# Patient Record
Sex: Female | Born: 1935 | Race: White | Hispanic: No | Marital: Married | Smoking: Never smoker
Health system: Southern US, Community
[De-identification: ages and names within clinical notes are randomized; demographics above are authoritative.]

## PROBLEM LIST (undated history)

## (undated) DIAGNOSIS — F0390 Unspecified dementia without behavioral disturbance: Secondary | ICD-10-CM

## (undated) DIAGNOSIS — I447 Left bundle-branch block, unspecified: Secondary | ICD-10-CM

## (undated) DIAGNOSIS — I4891 Unspecified atrial fibrillation: Secondary | ICD-10-CM

## (undated) DIAGNOSIS — D696 Thrombocytopenia, unspecified: Secondary | ICD-10-CM

## (undated) DIAGNOSIS — E785 Hyperlipidemia, unspecified: Secondary | ICD-10-CM

## (undated) DIAGNOSIS — M1990 Unspecified osteoarthritis, unspecified site: Secondary | ICD-10-CM

## (undated) DIAGNOSIS — I4892 Unspecified atrial flutter: Secondary | ICD-10-CM

## (undated) DIAGNOSIS — M545 Low back pain, unspecified: Secondary | ICD-10-CM

## (undated) DIAGNOSIS — I10 Essential (primary) hypertension: Secondary | ICD-10-CM

## (undated) DIAGNOSIS — S22089A Unspecified fracture of T11-T12 vertebra, initial encounter for closed fracture: Secondary | ICD-10-CM

## (undated) DIAGNOSIS — M8580 Other specified disorders of bone density and structure, unspecified site: Secondary | ICD-10-CM

## (undated) HISTORY — PX: CHOLECYSTECTOMY: SHX55

## (undated) HISTORY — PX: TOTAL KNEE ARTHROPLASTY: SHX125

## (undated) HISTORY — PX: ABDOMINAL HYSTERECTOMY: SHX81

---

## 2008-08-03 ENCOUNTER — Ambulatory Visit: Payer: Self-pay | Admitting: Family Medicine

## 2008-11-16 ENCOUNTER — Ambulatory Visit: Payer: Self-pay | Admitting: Family Medicine

## 2009-08-09 ENCOUNTER — Ambulatory Visit: Payer: Self-pay | Admitting: Surgery

## 2010-10-19 ENCOUNTER — Ambulatory Visit: Payer: Self-pay | Admitting: Surgery

## 2011-10-23 ENCOUNTER — Ambulatory Visit: Payer: Self-pay | Admitting: Surgery

## 2011-10-24 ENCOUNTER — Ambulatory Visit: Payer: Self-pay | Admitting: Surgery

## 2012-09-01 ENCOUNTER — Ambulatory Visit: Payer: Self-pay | Admitting: Surgery

## 2012-09-16 ENCOUNTER — Ambulatory Visit: Payer: Self-pay | Admitting: Internal Medicine

## 2012-10-15 ENCOUNTER — Ambulatory Visit: Payer: Self-pay | Admitting: Hematology and Oncology

## 2012-10-17 ENCOUNTER — Ambulatory Visit: Payer: Self-pay | Admitting: Hematology and Oncology

## 2012-10-17 LAB — CBC CANCER CENTER
Basophil #: 0 x10 3/mm (ref 0.0–0.1)
Basophil %: 0.7 %
Eosinophil %: 1.4 %
HCT: 41.2 % (ref 35.0–47.0)
Lymphocyte #: 1.4 x10 3/mm (ref 1.0–3.6)
MCH: 29.5 pg (ref 26.0–34.0)
MCHC: 34.4 g/dL (ref 32.0–36.0)
Monocyte #: 0.4 x10 3/mm (ref 0.2–0.9)
Monocyte %: 8.2 %
Neutrophil #: 2.6 x10 3/mm (ref 1.4–6.5)
Platelet: 124 x10 3/mm — ABNORMAL LOW (ref 150–440)
WBC: 4.5 x10 3/mm (ref 3.6–11.0)

## 2012-10-19 ENCOUNTER — Ambulatory Visit: Payer: Self-pay | Admitting: Hematology and Oncology

## 2012-11-14 LAB — CBC CANCER CENTER
Basophil #: 0 x10 3/mm (ref 0.0–0.1)
Basophil %: 0.8 %
HGB: 13.8 g/dL (ref 12.0–16.0)
MCH: 30 pg (ref 26.0–34.0)
MCHC: 34.8 g/dL (ref 32.0–36.0)
RDW: 13.5 % (ref 11.5–14.5)

## 2012-11-14 LAB — BASIC METABOLIC PANEL
BUN: 23 mg/dL — ABNORMAL HIGH (ref 7–18)
Calcium, Total: 9.4 mg/dL (ref 8.5–10.1)
Chloride: 99 mmol/L (ref 98–107)
Co2: 35 mmol/L — ABNORMAL HIGH (ref 21–32)
Creatinine: 1.25 mg/dL (ref 0.60–1.30)
EGFR (Non-African Amer.): 41 — ABNORMAL LOW
Glucose: 85 mg/dL (ref 65–99)
Osmolality: 279 (ref 275–301)

## 2012-11-18 ENCOUNTER — Ambulatory Visit: Payer: Self-pay | Admitting: Hematology and Oncology

## 2013-02-19 ENCOUNTER — Encounter: Payer: Self-pay | Admitting: Unknown Physician Specialty

## 2013-03-21 ENCOUNTER — Encounter: Payer: Self-pay | Admitting: Unknown Physician Specialty

## 2013-06-04 ENCOUNTER — Ambulatory Visit: Payer: Self-pay | Admitting: Hematology and Oncology

## 2013-06-04 LAB — BASIC METABOLIC PANEL
Anion Gap: 8 (ref 7–16)
BUN: 22 mg/dL — ABNORMAL HIGH (ref 7–18)
CO2: 35 mmol/L — AB (ref 21–32)
Calcium, Total: 9.4 mg/dL (ref 8.5–10.1)
Chloride: 98 mmol/L (ref 98–107)
Creatinine: 1.15 mg/dL (ref 0.60–1.30)
EGFR (African American): 52 — ABNORMAL LOW
EGFR (Non-African Amer.): 45 — ABNORMAL LOW
Glucose: 117 mg/dL — ABNORMAL HIGH (ref 65–99)
Osmolality: 286 (ref 275–301)
Potassium: 3 mmol/L — ABNORMAL LOW (ref 3.5–5.1)
SODIUM: 141 mmol/L (ref 136–145)

## 2013-06-04 LAB — CBC CANCER CENTER
Basophil #: 0 x10 3/mm (ref 0.0–0.1)
Basophil %: 0.9 %
Eosinophil #: 0.1 x10 3/mm (ref 0.0–0.7)
Eosinophil %: 2.6 %
HCT: 43.9 % (ref 35.0–47.0)
HGB: 14.6 g/dL (ref 12.0–16.0)
LYMPHS PCT: 33.6 %
Lymphocyte #: 1.7 x10 3/mm (ref 1.0–3.6)
MCH: 29.2 pg (ref 26.0–34.0)
MCHC: 33.2 g/dL (ref 32.0–36.0)
MCV: 88 fL (ref 80–100)
MONO ABS: 0.4 x10 3/mm (ref 0.2–0.9)
Monocyte %: 7.5 %
NEUTROS ABS: 2.8 x10 3/mm (ref 1.4–6.5)
Neutrophil %: 55.4 %
Platelet: 145 x10 3/mm — ABNORMAL LOW (ref 150–440)
RBC: 5 10*6/uL (ref 3.80–5.20)
RDW: 13.3 % (ref 11.5–14.5)
WBC: 5 x10 3/mm (ref 3.6–11.0)

## 2013-06-21 ENCOUNTER — Ambulatory Visit: Payer: Self-pay | Admitting: Hematology and Oncology

## 2014-01-14 ENCOUNTER — Inpatient Hospital Stay (HOSPITAL_COMMUNITY)
Admission: EM | Admit: 2014-01-14 | Discharge: 2014-01-18 | DRG: 551 | Disposition: A | Discharge status: Skilled Nursing Facility | Payer: Medicare Other | Source: Other Acute Inpatient Hospital | Attending: Neurosurgery | Admitting: Neurosurgery

## 2014-01-14 ENCOUNTER — Encounter (HOSPITAL_COMMUNITY): Payer: Self-pay | Admitting: Pulmonary Disease

## 2014-01-14 ENCOUNTER — Observation Stay: Payer: Self-pay | Admitting: Internal Medicine

## 2014-01-14 DIAGNOSIS — E785 Hyperlipidemia, unspecified: Secondary | ICD-10-CM | POA: Diagnosis present

## 2014-01-14 DIAGNOSIS — S22009A Unspecified fracture of unspecified thoracic vertebra, initial encounter for closed fracture: Secondary | ICD-10-CM | POA: Diagnosis present

## 2014-01-14 DIAGNOSIS — I621 Nontraumatic extradural hemorrhage: Secondary | ICD-10-CM | POA: Diagnosis present

## 2014-01-14 DIAGNOSIS — E876 Hypokalemia: Secondary | ICD-10-CM | POA: Diagnosis present

## 2014-01-14 DIAGNOSIS — D696 Thrombocytopenia, unspecified: Secondary | ICD-10-CM | POA: Diagnosis present

## 2014-01-14 DIAGNOSIS — F70 Mild intellectual disabilities: Secondary | ICD-10-CM | POA: Diagnosis present

## 2014-01-14 DIAGNOSIS — W19XXXA Unspecified fall, initial encounter: Secondary | ICD-10-CM | POA: Diagnosis present

## 2014-01-14 DIAGNOSIS — I10 Essential (primary) hypertension: Secondary | ICD-10-CM

## 2014-01-14 DIAGNOSIS — F0280 Dementia in other diseases classified elsewhere without behavioral disturbance: Secondary | ICD-10-CM | POA: Diagnosis present

## 2014-01-14 DIAGNOSIS — R339 Retention of urine, unspecified: Secondary | ICD-10-CM | POA: Diagnosis present

## 2014-01-14 DIAGNOSIS — M546 Pain in thoracic spine: Secondary | ICD-10-CM

## 2014-01-14 DIAGNOSIS — G8929 Other chronic pain: Secondary | ICD-10-CM | POA: Diagnosis present

## 2014-01-14 DIAGNOSIS — S22008A Other fracture of unspecified thoracic vertebra, initial encounter for closed fracture: Secondary | ICD-10-CM | POA: Diagnosis present

## 2014-01-14 HISTORY — DX: Low back pain, unspecified: M54.50

## 2014-01-14 HISTORY — DX: Essential (primary) hypertension: I10

## 2014-01-14 HISTORY — DX: Unspecified fracture of t11-T12 vertebra, initial encounter for closed fracture: S22.089A

## 2014-01-14 HISTORY — DX: Thrombocytopenia, unspecified: D69.6

## 2014-01-14 HISTORY — DX: Hyperlipidemia, unspecified: E78.5

## 2014-01-14 HISTORY — DX: Low back pain: M54.5

## 2014-01-14 LAB — CBC WITH DIFFERENTIAL/PLATELET
BASOS PCT: 0.5 %
Basophil #: 0 10*3/uL (ref 0.0–0.1)
EOS PCT: 0.7 %
Eosinophil #: 0 10*3/uL (ref 0.0–0.7)
HCT: 46.2 % (ref 35.0–47.0)
HGB: 15.3 g/dL (ref 12.0–16.0)
LYMPHS PCT: 18.4 %
Lymphocyte #: 1.2 10*3/uL (ref 1.0–3.6)
MCH: 29.2 pg (ref 26.0–34.0)
MCHC: 33.1 g/dL (ref 32.0–36.0)
MCV: 88 fL (ref 80–100)
MONOS PCT: 4.8 %
Monocyte #: 0.3 x10 3/mm (ref 0.2–0.9)
Neutrophil #: 4.7 10*3/uL (ref 1.4–6.5)
Neutrophil %: 75.6 %
PLATELETS: 134 10*3/uL — AB (ref 150–440)
RBC: 5.25 10*6/uL — ABNORMAL HIGH (ref 3.80–5.20)
RDW: 13.9 % (ref 11.5–14.5)
WBC: 6.3 10*3/uL (ref 3.6–11.0)

## 2014-01-14 LAB — COMPREHENSIVE METABOLIC PANEL
ALT: 28 U/L
ANION GAP: 7 (ref 7–16)
AST: 45 U/L — AB (ref 15–37)
Albumin: 4.3 g/dL (ref 3.4–5.0)
Alkaline Phosphatase: 61 U/L
BILIRUBIN TOTAL: 1 mg/dL (ref 0.2–1.0)
BUN: 17 mg/dL (ref 7–18)
CALCIUM: 9.2 mg/dL (ref 8.5–10.1)
CHLORIDE: 107 mmol/L (ref 98–107)
Co2: 29 mmol/L (ref 21–32)
Creatinine: 1.01 mg/dL (ref 0.60–1.30)
GFR CALC NON AF AMER: 53 — AB
Glucose: 122 mg/dL — ABNORMAL HIGH (ref 65–99)
OSMOLALITY: 288 (ref 275–301)
POTASSIUM: 3.4 mmol/L — AB (ref 3.5–5.1)
SODIUM: 143 mmol/L (ref 136–145)
TOTAL PROTEIN: 8.1 g/dL (ref 6.4–8.2)

## 2014-01-14 LAB — URINALYSIS, COMPLETE
Bacteria: NONE SEEN
Bilirubin,UR: NEGATIVE
Blood: NEGATIVE
Glucose,UR: NEGATIVE mg/dL (ref 0–75)
KETONE: NEGATIVE
Leukocyte Esterase: NEGATIVE
Nitrite: NEGATIVE
Ph: 9 (ref 4.5–8.0)
Protein: NEGATIVE
RBC,UR: 2 /HPF (ref 0–5)
Specific Gravity: 1.01 (ref 1.003–1.030)
Squamous Epithelial: <1
WBC UR: <1 /HPF (ref 0–5)

## 2014-01-14 LAB — MRSA PCR SCREENING: MRSA by PCR: NEGATIVE

## 2014-01-14 LAB — TROPONIN I: Troponin-I: <0.02 ng/mL

## 2014-01-14 LAB — TSH: Thyroid Stimulating Horm: 0.801 u[IU]/mL

## 2014-01-14 MED ORDER — FLEET ENEMA 7-19 GM/118ML RE ENEM
1.0000 | ENEMA | Freq: Once | RECTAL | Status: AC | PRN
  Filled 2014-01-14: qty 1

## 2014-01-14 MED ORDER — ZOLPIDEM TARTRATE 5 MG PO TABS: 5.0000 mg | ORAL_TABLET | Freq: Every evening | ORAL | Status: DC | PRN

## 2014-01-14 MED ORDER — LISINOPRIL 10 MG PO TABS
10.0000 mg | ORAL_TABLET | Freq: Every day | ORAL | Status: DC
  Administered 2014-01-15 – 2014-01-17 (×3): 10 mg via ORAL
  Filled 2014-01-14 (×3): qty 1

## 2014-01-14 MED ORDER — ALUM & MAG HYDROXIDE-SIMETH 200-200-20 MG/5ML PO SUSP
30.0000 mL | Freq: Four times a day (QID) | ORAL | Status: DC | PRN
  Administered 2014-01-17: 30 mL via ORAL
  Filled 2014-01-14: qty 30

## 2014-01-14 MED ORDER — BISACODYL 10 MG RE SUPP: 10.0000 mg | Freq: Every day | RECTAL | Status: DC | PRN

## 2014-01-14 MED ORDER — KCL IN DEXTROSE-NACL 20-5-0.45 MEQ/L-%-% IV SOLN
INTRAVENOUS | Status: DC
  Filled 2014-01-14 (×3): qty 1000

## 2014-01-14 MED ORDER — HYDROCODONE-ACETAMINOPHEN 5-325 MG PO TABS
1.0000 | ORAL_TABLET | ORAL | Status: DC | PRN
  Administered 2014-01-14: 2 via ORAL
  Administered 2014-01-16: 1 via ORAL
  Administered 2014-01-17 – 2014-01-18 (×4): 2 via ORAL
  Filled 2014-01-14 (×5): qty 2
  Filled 2014-01-14: qty 1

## 2014-01-14 MED ORDER — MORPHINE SULFATE 2 MG/ML IJ SOLN
1.0000 mg | INTRAMUSCULAR | Status: DC | PRN
  Administered 2014-01-14 – 2014-01-16 (×4): 2 mg via INTRAVENOUS
  Filled 2014-01-14 (×4): qty 1

## 2014-01-14 MED ORDER — SENNA 8.6 MG PO TABS
1.0000 | ORAL_TABLET | Freq: Two times a day (BID) | ORAL | Status: DC
  Administered 2014-01-15 – 2014-01-18 (×6): 8.6 mg via ORAL
  Filled 2014-01-14 (×9): qty 1

## 2014-01-14 MED ORDER — ACETAMINOPHEN 650 MG RE SUPP: 650.0000 mg | Freq: Four times a day (QID) | RECTAL | Status: DC | PRN

## 2014-01-14 MED ORDER — DOCUSATE SODIUM 100 MG PO CAPS
100.0000 mg | ORAL_CAPSULE | Freq: Two times a day (BID) | ORAL | Status: DC
  Administered 2014-01-15 – 2014-01-18 (×6): 100 mg via ORAL
  Filled 2014-01-14 (×9): qty 1

## 2014-01-14 MED ORDER — SIMVASTATIN 5 MG PO TABS
10.0000 mg | ORAL_TABLET | Freq: Every day | ORAL | Status: DC
  Administered 2014-01-15 – 2014-01-17 (×3): 10 mg via ORAL
  Filled 2014-01-14: qty 2
  Filled 2014-01-14: qty 1
  Filled 2014-01-14: qty 2
  Filled 2014-01-14: qty 1

## 2014-01-14 MED ORDER — ONDANSETRON HCL 4 MG/2ML IJ SOLN
4.0000 mg | Freq: Four times a day (QID) | INTRAMUSCULAR | Status: DC | PRN
  Administered 2014-01-15 (×2): 4 mg via INTRAVENOUS
  Filled 2014-01-14 (×2): qty 2

## 2014-01-14 MED ORDER — ACETAMINOPHEN 325 MG PO TABS: 650.0000 mg | ORAL_TABLET | Freq: Four times a day (QID) | ORAL | Status: DC | PRN

## 2014-01-14 MED ORDER — SENNOSIDES-DOCUSATE SODIUM 8.6-50 MG PO TABS
1.0000 | ORAL_TABLET | Freq: Every evening | ORAL | Status: DC | PRN
  Filled 2014-01-14: qty 1

## 2014-01-14 MED ORDER — ONDANSETRON HCL 4 MG PO TABS: 4.0000 mg | ORAL_TABLET | Freq: Four times a day (QID) | ORAL | Status: DC | PRN

## 2014-01-14 MED ORDER — HYDROCHLOROTHIAZIDE 25 MG PO TABS
25.0000 mg | ORAL_TABLET | Freq: Every day | ORAL | Status: DC
  Administered 2014-01-15 – 2014-01-18 (×4): 25 mg via ORAL
  Filled 2014-01-14 (×4): qty 1

## 2014-01-14 NOTE — H&P (Signed)
Rebecca Reed is an 80 y.o. female.   Chief Complaint: Fall today with leg weakness.  Transferred from [facility]. HPI: Patient is 80 year old female who lives in assisted living facility who fell this morning and was unable to get up beacause of back and leg pain.  A thoracic CT scan at [facility] shows T 11 fracture with minimal retropulsion.  An MRI showed epidural hematoma effacing subarachnoid space without severe cord compression.  She was transferred by [facility] for management.  No past medical history on file. Bilateral TKA and HTN  No past surgical history on file.  No family history on file. Social History:  has no tobacco, alcohol, and drug history on file.  Allergies: Allergies not on file  No prescriptions prior to admission    No results found for this or any previous visit (from the past 48 hour(s)). No results found.  Review of Systems  HENT: Negative.   Eyes: Negative.   Respiratory: Negative.   Cardiovascular: Negative.   Gastrointestinal: Negative.   Genitourinary: Negative.   Musculoskeletal: Positive for back pain, falls and joint pain.  Skin: Negative.   Neurological: Positive for focal weakness and weakness.  Endo/Heme/Allergies: Negative.   Psychiatric/Behavioral: Negative.     Blood pressure 141/113, pulse 78, resp. rate 30, SpO2 100.00%. Physical Exam  Constitutional: She is oriented to person, place, and time. She appears well-developed and well-nourished.  HENT:  Head: Normocephalic and atraumatic.  Right Ear: External ear normal.  Left Ear: External ear normal.  Eyes: Conjunctivae and EOM are normal. Pupils are equal, round, and reactive to light.  Neck: Normal range of motion. Neck supple.  Cardiovascular: Normal rate, regular rhythm, normal heart sounds and intact distal pulses.   Respiratory: Effort normal and breath sounds normal.  GI: Soft. Bowel sounds are normal.  Musculoskeletal:       Thoracic back: She exhibits bony tenderness and pain.   Neurological: She is alert and oriented to person, place, and time. She has normal strength. No cranial nerve deficit or sensory deficit. GCS eye subscore is 4. GCS verbal subscore is 5. GCS motor subscore is 6. She displays no Babinski's sign on the right side. She displays no Babinski's sign on the left side.  Reflex Scores:      Tricep reflexes are 2+ on the right side and 2+ on the left side.      Bicep reflexes are 2+ on the right side and 2+ on the left side.      Brachioradialis reflexes are 2+ on the right side and 2+ on the left side.      Patellar reflexes are 2+ on the right side and 2+ on the left side.      Achilles reflexes are 2+ on the right side and 2+ on the left side. Psychiatric: She has a normal mood and affect. Her speech is normal and behavior is normal. Judgment and thought content normal. Cognition and memory are normal.    Rectal exam and perineal sensation normal.  Denies numbness in legs, does have some radicular pain in T 11 distribution bilaterally. Has back pain with leg movement, but exhibits full strength in all motor groups.  Assessment/Plan Patient appears to have back pain due to her acute fracture, but does not have weakness on exam and has normal rectal tone.  She will be admitted to ICU and observed and may mobilize with a brace for pain control.  Will perform serial neuro checks and observe in  ICU tonight.  Shontavia Mickel D 01/14/2014, 8:23 PM

## 2014-01-14 NOTE — Progress Notes (Signed)
[facility] Physician-Brief Progress Note Patient Name: Rebecca Reed DOB: [birth year] MRN: [id number]   Date of Service  01/14/2014  HPI/Events of Note  80 year old female with PMHx of HTN, HLD, cord compression, TKA, Chronic low back pain from ALF who had an accidental fall this morning and was unable to get up beacause of back and leg pain. A thoracic CT scan at [facility] shows T 11 fracture with minimal retropulsion. An MRI showed epidural hematoma effacing subarachnoid space without severe cord compression. She was [facility] for further management and evaluation by neurosurgery   eICU Interventions  Monitor BP - restart home meds, elevated may BP be related to pain.  Pain management will also be important.  Severe cord compression, epidural hematoma - management per surgery.  Frequent neuro checks, bp control   HTN  - restart home meds, may give hydralazine  IV for SBP >183mmHg, hold for HR>100 bpm       Intervention Category Evaluation Type: New Patient Evaluation  Taron Mondor 01/14/2014, 9:36 PM

## 2014-01-14 NOTE — Consult Note (Signed)
PULMONARY / CRITICAL CARE MEDICINE   Name: Rebecca Reed MRN: [id number] DOB: [birth year]    ADMISSION DATE:  01/14/2014 CONSULTATION DATE:  01/14/2014  REFERRING MD :  Venetia Maxon  CHIEF COMPLAINT:  Fall  INITIAL PRESENTATION: 80 yo female from ALF who presented to [facility] ED 8/27 s/p fall 8/27 AM. MRI t-spine showed burst fracture of T11 and epidural hematoma without severe cord compression. She was transferred to [facility] for neurosurgical evaluation. PCCM asked to consult for medical management.   STUDIES:  8/27 T spine MRI >stable burst freacture of T11 with epidural hematoma from T8-T11. 8/27 CT head > No acute intracranial abnormality, chronic age related atrophy  SIGNIFICANT EVENTS: 8/27 fall at ALF, to ED T11 fracture and epidural hematoma, to [facility] NICU.   HISTORY OF PRESENT ILLNESS:  80 year old female with PMH as below, which includes HTN, Presented to [facility] ED 8//27. She lives in assisted living facility where she 2 suffered 2 falls 8/27 AM. States she struck her head on second fall. CC in ED was back pain. In ED she was found to have T11 fracture with associated epidural hematoma. There was not severe cord compression. She was been transferred to [facility] NICU for neurosurgical evaluation. PCCM has been consulted for medical management.   PAST MEDICAL HISTORY :  Past Medical History  Diagnosis Date  . HTN (hypertension)   . Thrombocytopenia   . Memory loss   . Hyperlipidemia   . Low back pain     Chronic  . T11 vertebral fracture     01/14/2014   No past surgical history on file. Prior to Admission medications   Medication Sig Start Date End Date Taking? Authorizing Provider  Calcium Carb-Cholecalciferol (CALCIUM 600 + D) 600-200 MG-UNIT TABS Take 1 tablet by mouth daily.   Yes Historical Provider, MD  donepezil (ARICEPT) 5 MG tablet Take 5 mg by mouth at bedtime.   Yes Historical Provider, MD  hydrochlorothiazide (HYDRODIURIL) 25 MG tablet Take 25 mg by mouth daily.   Yes Historical  Provider, MD  lisinopril (PRINIVIL,ZESTRIL) 20 MG tablet Take 20 mg by mouth daily.   Yes Historical Provider, MD  Multiple Vitamin (MULTIVITAMIN WITH MINERALS) TABS tablet Take 1 tablet by mouth daily.   Yes Historical Provider, MD  simvastatin (ZOCOR) 10 MG tablet Take 10 mg by mouth every morning.   Yes Historical Provider, MD   No Known Allergies  FAMILY HISTORY:  No family history on file. SOCIAL HISTORY:  has no tobacco, alcohol, and drug history on file.  REVIEW OF SYSTEMS:   Bolds are positive  Constitutional: weight loss, gain, night sweats, Fevers, chills, fatigue .  HEENT: headaches, Sore throat, sneezing, nasal congestion, post nasal drip, Difficulty swallowing, Tooth/dental problems, visual complaints visual changes, ear ache CV:  chest pain, radiates: ,Orthopnea, PND, swelling in lower extremities, dizziness, palpitations, syncope.  GI  heartburn, indigestion, abdominal pain, nausea, vomiting, diarrhea, change in bowel habits, loss of appetite, bloody stools.  Resp: cough, productive: , hemoptysis, dyspnea, chest pain, pleuritic.  Skin: rash or itching or icterus GU: dysuria, change in color of urine, urgency or frequency. flank pain, hematuria  MS: Mid back pain or swelling. decreased range of motion  Psych: change in mood or affect. depression or anxiety.  Neuro: difficulty with speech, weakness, numbness, ataxia   SUBJECTIVE:   VITAL SIGNS: Temp:  [97.4 F (36.3 C)-98.7 F (37.1 C)] 98.3 F (36.8 C) (08/28 0800) Pulse Rate:  [61-114] 65 (08/28 0900) Resp:  [10-35]  11 (08/28 0900) BP: (131-209)/(48-113) 135/82 mmHg (08/28 0900) SpO2:  [95 %-100 %] 98 % (08/28 0900) HEMODYNAMICS:   VENTILATOR SETTINGS:   INTAKE / OUTPUT:  Intake/Output Summary (Last 24 hours) at 01/15/14 1009 Last data filed at 01/15/14 0930  Gross per 24 hour  Intake  637.5 ml  Output    950 ml  Net -312.5 ml    PHYSICAL EXAMINATION: General:  Elderly female, overweight, in mild  distress Neuro:  Alert, oriented. Some confusion. Responds and follows commands appropriately. Good sensation and strength to BLE.  HEENT:  [place]/AT, PERRL. No JVD noted.  Cardiovascular:  Tachy, regular rhythm Lungs:  Respirations even, unlabored. Clear anterior breath sounds bilaterally.  Abdomen:  Soft, non-distended. Normoactive BS Musculoskeletal:  No acute deformity. ROM limited by back pain.  Skin:  Intact  LABS:  CBC  Recent Labs Lab 01/15/14 0430  WBC 9.7  HGB 14.9  HCT 42.9  PLT 114*   Coag's  Recent Labs Lab 01/15/14 0430  INR 1.14   BMET  Recent Labs Lab 01/15/14 0430  NA 140  K 3.4*  CL 101  CO2 23  BUN 19  CREATININE 0.87  GLUCOSE 173*   Electrolytes  Recent Labs Lab 01/15/14 0430  CALCIUM 8.9   Sepsis Markers No results found for this basename: LATICACIDVEN, PROCALCITON, O2SATVEN,  in the last 168 hours ABG No results found for this basename: PHART, PCO2ART, PO2ART,  in the last 168 hours Liver Enzymes No results found for this basename: AST, ALT, ALKPHOS, BILITOT, ALBUMIN,  in the last 168 hours Cardiac Enzymes  Recent Labs Lab 01/15/14 0430  TROPONINI <0.30   Glucose No results found for this basename: GLUCAP,  in the last 168 hours  Imaging No results found.   ASSESSMENT / PLAN:  PULMONARY A: No acute issues  P:   Supplemental O2 if needed to maintain SpO2 greater than 92%  CARDIOVASCULAR A:  Hypertension - probably pain response LBBB on ECG, unclear if this in new finding.  H/o HTN  P:  PRN hydralazine for SBP > Continue preadmission antihypertensives (HCTZ, lisinopril) Continue preadmission statin Repeat ECG Trend troponin  RENAL A:   No acute issues  P:   KVO IVF Replace electrolytes as indicated Follow Bmet  GASTROINTESTINAL A:   No acute issues  P:   Regular Diet PO PPI for SUP  HEMATOLOGIC A:   Thrombocytopenia - appears chronic  P:  Follow CBC VTE ppx:  SCDs Coags  INFECTIOUS A:   No acute issues  P:   Monitor clinically  ENDOCRINE A:   No acute issues   P:   Monitor glucose with Bmet  NEUROLOGIC A:   T11 burst fracture with epidural hematoma T8-T11 Back pain H/o mild dementia reported  P:   RASS goal: 0 IV pain control per Neurosurgery (IV morphine, hydrocodone)  Joneen Roach, ACNP [place] [facility] Pager [phone] or [phone]  I have personally obtained a history, examined the patient, evaluated laboratory and imaging results, formulated the assessment and plan and placed orders.  Patient seen and examined, agree with above note.  I dictated the care and orders written for this patient under my direction.  Alyson Reedy, MD [phone]

## 2014-01-15 ENCOUNTER — Inpatient Hospital Stay (HOSPITAL_COMMUNITY): Payer: Medicare Other

## 2014-01-15 ENCOUNTER — Encounter (HOSPITAL_COMMUNITY): Payer: Self-pay | Admitting: Pulmonary Disease

## 2014-01-15 DIAGNOSIS — E876 Hypokalemia: Secondary | ICD-10-CM

## 2014-01-15 DIAGNOSIS — M546 Pain in thoracic spine: Secondary | ICD-10-CM

## 2014-01-15 DIAGNOSIS — S22009A Unspecified fracture of unspecified thoracic vertebra, initial encounter for closed fracture: Principal | ICD-10-CM

## 2014-01-15 DIAGNOSIS — I10 Essential (primary) hypertension: Secondary | ICD-10-CM

## 2014-01-15 LAB — TROPONIN I: Troponin I: <0.3 ng/mL (ref <0.30)

## 2014-01-15 LAB — URINALYSIS, ROUTINE W REFLEX MICROSCOPIC
Bilirubin Urine: NEGATIVE
GLUCOSE, UA: 100 mg/dL — AB
KETONES UR: 15 mg/dL — AB
Nitrite: NEGATIVE
PROTEIN: 100 mg/dL — AB
Specific Gravity, Urine: 1.013 (ref 1.005–1.030)
UROBILINOGEN UA: 0.2 mg/dL (ref 0.0–1.0)
pH: 7.5 (ref 5.0–8.0)

## 2014-01-15 LAB — BASIC METABOLIC PANEL
Anion gap: 16 — ABNORMAL HIGH (ref 5–15)
BUN: 19 mg/dL (ref 6–23)
CHLORIDE: 101 meq/L (ref 96–112)
CO2: 23 mEq/L (ref 19–32)
Calcium: 8.9 mg/dL (ref 8.4–10.5)
Creatinine, Ser: 0.87 mg/dL (ref 0.50–1.10)
GFR calc Af Amer: 71 mL/min — ABNORMAL LOW (ref >90)
GFR, EST NON AFRICAN AMERICAN: 61 mL/min — AB (ref >90)
Glucose, Bld: 173 mg/dL — ABNORMAL HIGH (ref 70–99)
POTASSIUM: 3.4 meq/L — AB (ref 3.7–5.3)
SODIUM: 140 meq/L (ref 137–147)

## 2014-01-15 LAB — CBC
HEMATOCRIT: 42.9 % (ref 36.0–46.0)
HEMOGLOBIN: 14.9 g/dL (ref 12.0–15.0)
MCH: 29.7 pg (ref 26.0–34.0)
MCHC: 34.7 g/dL (ref 30.0–36.0)
MCV: 85.6 fL (ref 78.0–100.0)
Platelets: 114 10*3/uL — ABNORMAL LOW (ref 150–400)
RBC: 5.01 MIL/uL (ref 3.87–5.11)
RDW: 13.9 % (ref 11.5–15.5)
WBC: 9.7 10*3/uL (ref 4.0–10.5)

## 2014-01-15 LAB — PROTIME-INR
INR: 1.14 (ref 0.00–1.49)
PROTHROMBIN TIME: 14.6 s (ref 11.6–15.2)

## 2014-01-15 LAB — URINE MICROSCOPIC-ADD ON

## 2014-01-15 MED ORDER — POTASSIUM CHLORIDE CRYS ER 20 MEQ PO TBCR
40.0000 meq | EXTENDED_RELEASE_TABLET | Freq: Three times a day (TID) | ORAL | Status: AC
  Administered 2014-01-15 (×2): 40 meq via ORAL
  Filled 2014-01-15 (×2): qty 2

## 2014-01-15 MED ORDER — HYDRALAZINE HCL 20 MG/ML IJ SOLN
10.0000 mg | INTRAMUSCULAR | Status: DC | PRN
  Administered 2014-01-15 (×2): 20 mg via INTRAVENOUS
  Administered 2014-01-17: 10 mg via INTRAVENOUS
  Filled 2014-01-15 (×3): qty 1

## 2014-01-15 NOTE — Progress Notes (Signed)
UR completed.  Deondra Labrador, RN BSN MHA CCM Trauma/Neuro ICU Case Manager [phone]  

## 2014-01-15 NOTE — Progress Notes (Signed)
Subjective: Patient reports back a bit better  Objective: Vital signs in last 24 hours: Temp:  [97.4 F (36.3 C)-98.7 F (37.1 C)] 98.4 F (36.9 C) (08/28 1600) Pulse Rate:  [29-114] 75 (08/28 1400) Resp:  [10-35] 14 (08/28 1400) BP: (110-209)/(48-113) 155/77 mmHg (08/28 1400) SpO2:  [81 %-100 %] 81 % (08/28 1400) Weight:  [71.6 kg (157 lb 13.6 oz)] 71.6 kg (157 lb 13.6 oz) (08/28 1100)  Intake/Output from previous day: 08/27 0701 - 08/28 0700 In: 487.5 [I.V.:487.5] Out: 50 [Urine:50] Intake/Output this shift: Total I/O In: 150 [I.V.:150] Out: 900 [Urine:900]  Physical Exam: Strength full in both legs.  No numbness.  Lab Results:  Recent Labs  01/15/14 0430  WBC 9.7  HGB 14.9  HCT 42.9  PLT 114*   BMET  Recent Labs  01/15/14 0430  NA 140  K 3.4*  CL 101  CO2 23  GLUCOSE 173*  BUN 19  CREATININE 0.87  CALCIUM 8.9    Studies/Results: Dg Chest Port 1 View  01/15/2014   CLINICAL DATA:  Central line placement.  EXAM: PORTABLE CHEST - 1 VIEW  COMPARISON:  01/14/2014.  FINDINGS: Decreased inspiration with interval borderline enlargement of the cardiac silhouette. Oval left jugular catheter with its tip in the superior vena cava. No pneumothorax. Mild diffuse peribronchial thickening with mild progression. Thoracic spine degenerative changes. Cholecystectomy clips.  IMPRESSION: 1. Left jugular catheter tip in the superior vena cava without pneumothorax. 2. Mild bronchitic changes with mild progression. 3. Interval borderline cardiomegaly.   Electronically Signed   By: Gordan Payment M.D.   On: 01/15/2014 00:53    Assessment/Plan: Mobilize in brace with PT.  May transfer to floor when doing well.    LOS: 1 day    Dorian Heckle, MD 01/15/2014, 6:35 PM

## 2014-01-15 NOTE — Procedures (Signed)
Central Venous Catheter Insertion Procedure Note RUBBY BARBARY [id number] 1932/04/17  Procedure: Insertion of Central Venous Catheter Indications: Drug and/or fluid administration  Procedure Details Consent: Risks of procedure as well as the alternatives and risks of each were explained to the (patient/caregiver).  Consent for procedure obtained. Time Out: Verified patient identification, verified procedure, site/side was marked, verified correct patient position, special equipment/implants available, medications/allergies/relevent history reviewed, required imaging and test results available.  Performed  Maximum sterile technique was used including antiseptics, cap, gloves, gown, hand hygiene, mask and sheet. Skin prep: Chlorhexidine; local anesthetic administered A antimicrobial bonded/coated triple lumen catheter was placed in the left internal jugular vein using the Seldinger technique. Ultrasound guidance used.Yes.   Catheter placed to 18 cm. Blood aspirated via all 3 ports and then flushed x 3. Line sutured x 2 and dressing applied.  Evaluation Blood flow good Complications: No apparent complications Patient did tolerate procedure well. Chest X-ray ordered to verify placement.  CXR: pending.  Joneen Roach, ACNP [place] [facility] Pager [phone] or [phone]  I supervised the procedure.  Alyson Reedy, M.D. [facility] Pulmonary/Critical Care Medicine. Pager: [phone]. After hours pager: [phone].

## 2014-01-15 NOTE — Evaluation (Signed)
Physical Therapy Evaluation Patient Details Name: Rebecca Reed MRN: [id number] DOB: 1932-01-26 Today's Date: 01/15/2014   History of Present Illness  80 yo female from Independent Living Facility who presented to [facility] ED 8/27 s/p fall 8/27 AM. MRI t-spine showed burst fracture of T11 and epidural hematoma without severe cord compression. She was transferred to [facility] for neurosurgical evaluation.  Clinical Impression  Patient demonstrates deficits in mobility as indicated below. Will need continued skilled PT to address deficits and maximize function. Patient is from independent living at [facility]. At this time, patient is presenting with some confusion and decreased comprehension of injury and short term memory. Patient will need continued inpatient skilled PT upon acute discharge, at this time, highly recommend return to familiar venue for continued ST SNF care until patient pain decreased and mobility increases to safe function. Will see as indicated and progress as tolerated.    Follow Up Recommendations SNF;Supervision/Assistance - 24 hour    Equipment Recommendations  Rolling walker with 5" wheels    Recommendations for Other Services       Precautions / Restrictions Precautions Precautions: Back Required Braces or Orthoses: Spinal Brace Spinal Brace: Thoracolumbosacral orthotic;Applied in supine position Restrictions Weight Bearing Restrictions: No      Mobility  Bed Mobility Overal bed mobility: Needs Assistance;+2 for physical assistance Bed Mobility: Rolling;Sidelying to Sit Rolling: Min assist Sidelying to sit: Mod assist;+2 for physical assistance       General bed mobility comments: assist to elevate trunk to upright, assist for rolling to don brace, patient limited by pain  Transfers Overall transfer level: Needs assistance Equipment used: Rolling walker (2 wheeled) Transfers: Sit to/from [employer] Sit to Stand: Min assist Stand pivot  transfers: Min assist;+2 physical assistance       General transfer comment: min assist for stability and positioning for safety during transfer to chair  Ambulation/Gait Ambulation/Gait assistance: Min assist Ambulation Distance (Feet): 6 Feet Assistive device: Rolling walker (2 wheeled)       General Gait Details: pivotal steps to chair  Stairs            Wheelchair Mobility    Modified Rankin (Stroke Patients Only)       Balance                                             Pertinent Vitals/Pain Pain Assessment: Faces Faces Pain Scale: Hurts whole lot Pain Location: back    Home Living Family/patient expects to be discharged to:: Assisted living (Independent living at [facility])               Home Equipment: Grab bars - toilet;Grab bars - tub/shower      Prior Function Level of Independence: Independent               Hand Dominance   Dominant Hand: Right    Extremity/Trunk Assessment   Upper Extremity Assessment: Generalized weakness           Lower Extremity Assessment: Generalized weakness         Communication   Communication: HOH  Cognition Arousal/Alertness: Awake/alert Behavior During Therapy: WFL for tasks assessed/performed Overall Cognitive Status: Impaired/Different from baseline Area of Impairment: Memory;Safety/judgement;Awareness;Problem solving     Memory: Decreased recall of precautions;Decreased short-term memory   Safety/Judgement: Decreased awareness of safety;Decreased awareness of deficits Awareness: Intellectual Problem  Solving: Difficulty sequencing;Requires verbal cues;Requires tactile cues General Comments: patient continously asking if we were going home today, asked to put her clothes on to go home, even after being oriented to ICU and told that she was not leaving the hospital today patient then asked if she should pack up her lunch to take home with her today.    General  Comments General comments (skin integrity, edema, etc.): seated EOB, patient vomitted, nsg called for anti- nausea medicine    Exercises        Assessment/Plan    PT Assessment Patient needs continued PT services  PT Diagnosis Difficulty walking;Acute pain   PT Problem List Decreased strength;Decreased range of motion;Decreased activity tolerance;Decreased balance;Decreased mobility;Decreased safety awareness;Pain  PT Treatment Interventions DME instruction;Gait training;Functional mobility training;Therapeutic activities;Therapeutic exercise;Balance training;Patient/family education   PT Goals (Current goals can be found in the Care Plan section) Acute Rehab PT Goals Patient Stated Goal: to go home today PT Goal Formulation: With patient Time For Goal Achievement: 01/29/14 Potential to Achieve Goals: Fair    Frequency Min 3X/week   Barriers to discharge        Co-evaluation               End of Session Equipment Utilized During Treatment: Gait belt;Back brace Activity Tolerance: Patient limited by pain Patient left: in chair;with call bell/phone within reach Nurse Communication: Mobility status         Time: 1324-1350 PT Time Calculation (min): 26 min   Charges:   PT Evaluation $Initial PT Evaluation Tier I: 1 Procedure PT Treatments $Therapeutic Activity: 23-37 mins   PT G CodesFabio Asa 01/15/2014, 2:40 PM Charlotte Crumb, PT DPT  [phone]

## 2014-01-15 NOTE — Progress Notes (Signed)
PULMONARY / CRITICAL CARE MEDICINE   Name: Rebecca Reed MRN: [id number] DOB: Apr 30, 1932    ADMISSION DATE:  01/14/2014 CONSULTATION DATE:  01/14/2014  REFERRING MD :  Venetia Maxon  CHIEF COMPLAINT:  Fall  INITIAL PRESENTATION: 80 yo female from ALF who presented to [facility] ED 8/27 s/p fall 8/27 AM. MRI t-spine showed burst fracture of T11 and epidural hematoma without severe cord compression. She was transferred to [facility] for neurosurgical evaluation. PCCM asked to consult for medical management.   STUDIES:  8/27 T spine MRI >stable burst freacture of T11 with epidural hematoma from T8-T11. 8/27 CT head > No acute intracranial abnormality, chronic age related atrophy  SIGNIFICANT EVENTS: 8/27 fall at ALF, to ED T11 fracture and epidural hematoma, to [facility] NICU.   SUBJECTIVE: No events overnight, c/o nausea.  VITAL SIGNS: Temp:  [97.4 F (36.3 C)-98.7 F (37.1 C)] 98.3 F (36.8 C) (08/28 0800) Pulse Rate:  [61-114] 65 (08/28 0900) Resp:  [10-35] 11 (08/28 0900) BP: (131-209)/(48-113) 135/82 mmHg (08/28 0900) SpO2:  [95 %-100 %] 98 % (08/28 0900)  HEMODYNAMICS:   VENTILATOR SETTINGS:   INTAKE / OUTPUT:  Intake/Output Summary (Last 24 hours) at 01/15/14 1015 Last data filed at 01/15/14 0930  Gross per 24 hour  Intake  637.5 ml  Output    950 ml  Net -312.5 ml   PHYSICAL EXAMINATION: General:  Elderly female, overweight, in mild distress Neuro:  Alert, oriented. Some confusion, mild dementia baseline. Responds and follows commands appropriately. Good sensation and strength to BLE.  HEENT:  [place]/AT, PERRL. No JVD noted.  Cardiovascular:  Tachy, regular rhythm Lungs:  Respirations even, unlabored. Clear anterior breath sounds bilaterally.  Abdomen:  Soft, non-distended. Normoactive BS Musculoskeletal:  No acute deformity. ROM limited by back pain.  Skin:  Intact  LABS:  CBC  Recent Labs Lab 01/15/14 0430  WBC 9.7  HGB 14.9  HCT 42.9  PLT 114*   Coag's  Recent Labs Lab  01/15/14 0430  INR 1.14   BMET  Recent Labs Lab 01/15/14 0430  NA 140  K 3.4*  CL 101  CO2 23  BUN 19  CREATININE 0.87  GLUCOSE 173*   Electrolytes  Recent Labs Lab 01/15/14 0430  CALCIUM 8.9   Sepsis Markers No results found for this basename: LATICACIDVEN, PROCALCITON, O2SATVEN,  in the last 168 hours ABG No results found for this basename: PHART, PCO2ART, PO2ART,  in the last 168 hours Liver Enzymes No results found for this basename: AST, ALT, ALKPHOS, BILITOT, ALBUMIN,  in the last 168 hours Cardiac Enzymes  Recent Labs Lab 01/15/14 0430  TROPONINI <0.30   Glucose No results found for this basename: GLUCAP,  in the last 168 hours  Imaging No results found.   ASSESSMENT / PLAN:  PULMONARY A: No acute issues  P:   Supplemental O2 if needed to maintain SpO2 greater than 92%. Titrate O2 for sat of 88-92%.  CARDIOVASCULAR A:  Hypertension - probably pain response LBBB on ECG, unclear if this in new finding.  H/o HTN  P:  PRN hydralazine for SBP > Continue preadmission antihypertensives (HCTZ, lisinopril) Continue preadmission statin  RENAL A:   Hypokalemia  P:   KVO IVF Replace electrolytes as indicated Follow Bmet  GASTROINTESTINAL A:   No acute issues  P:   Regular Diet PO PPI for SUP  HEMATOLOGIC A:   Thrombocytopenia - appears chronic  P:  Follow CBC VTE ppx: SCDs Coags  INFECTIOUS A:  No acute issues  P:   Monitor clinically  ENDOCRINE A:   No acute issues   P:   Monitor glucose with Bmet  NEUROLOGIC A:   T11 burst fracture with epidural hematoma T8-T11 Back pain H/o mild dementia reported  P:   IV pain control per Neurosurgery (IV morphine, hydrocodone)  I have personally obtained a history, examined the patient, evaluated laboratory and imaging results, formulated the assessment and plan and placed orders.  Alyson Reedy, M.D. [facility] Pulmonary/Critical Care Medicine. Pager:  [phone]. After hours pager: [phone].

## 2014-01-16 LAB — BASIC METABOLIC PANEL
Anion gap: 11 (ref 5–15)
BUN: 30 mg/dL — AB (ref 6–23)
CALCIUM: 9.2 mg/dL (ref 8.4–10.5)
CO2: 25 mEq/L (ref 19–32)
CREATININE: 1.04 mg/dL (ref 0.50–1.10)
Chloride: 103 mEq/L (ref 96–112)
GFR calc Af Amer: 57 mL/min — ABNORMAL LOW (ref >90)
GFR, EST NON AFRICAN AMERICAN: 49 mL/min — AB (ref >90)
GLUCOSE: 127 mg/dL — AB (ref 70–99)
Potassium: 3.8 mEq/L (ref 3.7–5.3)
Sodium: 139 mEq/L (ref 137–147)

## 2014-01-16 LAB — CBC
HCT: 42 % (ref 36.0–46.0)
HEMOGLOBIN: 14.6 g/dL (ref 12.0–15.0)
MCH: 29.5 pg (ref 26.0–34.0)
MCHC: 34.8 g/dL (ref 30.0–36.0)
MCV: 84.8 fL (ref 78.0–100.0)
Platelets: 124 10*3/uL — ABNORMAL LOW (ref 150–400)
RBC: 4.95 MIL/uL (ref 3.87–5.11)
RDW: 14.2 % (ref 11.5–15.5)
WBC: 10 10*3/uL (ref 4.0–10.5)

## 2014-01-16 LAB — MAGNESIUM: MAGNESIUM: 2.5 mg/dL (ref 1.5–2.5)

## 2014-01-16 LAB — PHOSPHORUS: Phosphorus: 2.3 mg/dL (ref 2.3–4.6)

## 2014-01-16 MED ORDER — POTASSIUM CHLORIDE CRYS ER 20 MEQ PO TBCR
40.0000 meq | EXTENDED_RELEASE_TABLET | Freq: Three times a day (TID) | ORAL | Status: AC
  Administered 2014-01-16 (×2): 40 meq via ORAL
  Filled 2014-01-16 (×2): qty 2

## 2014-01-16 NOTE — Progress Notes (Signed)
PULMONARY / CRITICAL CARE MEDICINE   Name: Rebecca Reed MRN: [id number] DOB: [birth year]    ADMISSION DATE:  01/14/2014 CONSULTATION DATE:  01/14/2014  REFERRING MD :  Venetia Maxon  CHIEF COMPLAINT:  Fall  INITIAL PRESENTATION: 80 yo female from ALF who presented to [facility] ED 8/27 s/p fall 8/27 AM. MRI t-spine showed burst fracture of T11 and epidural hematoma without severe cord compression. She was transferred to [facility] for neurosurgical evaluation. PCCM asked to consult for medical management.   STUDIES:  8/27 T spine MRI >stable burst freacture of T11 with epidural hematoma from T8-T11. 8/27 CT head > No acute intracranial abnormality, chronic age related atrophy  SIGNIFICANT EVENTS: 8/27 fall at ALF, to ED T11 fracture and epidural hematoma, to [facility] NICU.   SUBJECTIVE: No events overnight, c/o nausea.  VITAL SIGNS: Temp:  [98.2 F (36.8 C)-99.1 F (37.3 C)] 98.7 F (37.1 C) (08/29 0400) Pulse Rate:  [29-78] 78 (08/29 0700) Resp:  [10-26] 21 (08/29 0700) BP: (110-172)/(42-89) 143/76 mmHg (08/29 0700) SpO2:  [92 %-100 %] 94 % (08/29 0700) Weight:  [157 lb 13.6 oz (71.6 kg)] 157 lb 13.6 oz (71.6 kg) (08/28 1100)  HEMODYNAMICS:   VENTILATOR SETTINGS:   INTAKE / OUTPUT:  Intake/Output Summary (Last 24 hours) at 01/16/14 0740 Last data filed at 01/16/14 0600  Gross per 24 hour  Intake    350 ml  Output   1720 ml  Net  -1370 ml   PHYSICAL EXAMINATION: General:  Elderly female, overweight, in mild distress Neuro:  Alert, oriented. Some confusion, mild dementia baseline. Responds and follows commands appropriately. Good sensation and strength to BLE.  HEENT:  [place]/AT, PERRL. No JVD noted.  Cardiovascular:  Tachy, regular rhythm Lungs:  Respirations even, unlabored. Clear anterior breath sounds bilaterally.  Abdomen:  Soft, non-distended. Normoactive BS Musculoskeletal:  No acute deformity. ROM limited by back pain.  Skin:  Intact  LABS:  CBC  Recent Labs Lab 01/15/14 0430  01/16/14 0238  WBC 9.7 10.0  HGB 14.9 14.6  HCT 42.9 42.0  PLT 114* 124*   Coag's  Recent Labs Lab 01/15/14 0430  INR 1.14   BMET  Recent Labs Lab 01/15/14 0430 01/16/14 0238  NA 140 139  K 3.4* 3.8  CL 101 103  CO2 23 25  BUN 19 30*  CREATININE 0.87 1.04  GLUCOSE 173* 127*   Electrolytes  Recent Labs Lab 01/15/14 0430 01/16/14 0238  CALCIUM 8.9 9.2  MG  --  2.5  PHOS  --  2.3   Sepsis Markers No results found for this basename: LATICACIDVEN, PROCALCITON, O2SATVEN,  in the last 168 hours ABG No results found for this basename: PHART, PCO2ART, PO2ART,  in the last 168 hours Liver Enzymes No results found for this basename: AST, ALT, ALKPHOS, BILITOT, ALBUMIN,  in the last 168 hours Cardiac Enzymes  Recent Labs Lab 01/15/14 0430 01/15/14 1120  TROPONINI <0.30 <0.30   Glucose No results found for this basename: GLUCAP,  in the last 168 hours  Imaging Dg Chest Port 1 View  01/15/2014   CLINICAL DATA:  Central line placement.  EXAM: PORTABLE CHEST - 1 VIEW  COMPARISON:  01/14/2014.  FINDINGS: Decreased inspiration with interval borderline enlargement of the cardiac silhouette. Oval left jugular catheter with its tip in the superior vena cava. No pneumothorax. Mild diffuse peribronchial thickening with mild progression. Thoracic spine degenerative changes. Cholecystectomy clips.  IMPRESSION: 1. Left jugular catheter tip in the superior vena cava without pneumothorax. 2.  Mild bronchitic changes with mild progression. 3. Interval borderline cardiomegaly.   Electronically Signed   By: Gordan Payment M.D.   On: 01/15/2014 00:53   ASSESSMENT / PLAN:  PULMONARY A: No acute issues  P:   Titrate O2 for sat of 88-92%. IS and flutter valve  CARDIOVASCULAR A:  Hypertension - probably pain response LBBB on ECG, unclear if this in new finding.  H/o HTN  P:  PRN hydralazine for SBP > Continue preadmission antihypertensives (HCTZ, lisinopril) Continue  preadmission statin  RENAL A:   Hypokalemia  P:   KVO IVF Replace electrolytes as indicated Follow Bmet  GASTROINTESTINAL A:   No acute issues  P:   Regular Diet PO PPI for SUP  HEMATOLOGIC A:   Thrombocytopenia - appears chronic  P:  Follow CBC VTE ppx: SCDs Coags  INFECTIOUS A:   No acute issues  P:   Monitor clinically  ENDOCRINE A:   No acute issues   P:   Monitor glucose with Bmet  NEUROLOGIC A:   T11 burst fracture with epidural hematoma T8-T11 Back pain H/o mild dementia reported  P:   IV pain control per Neurosurgery (IV morphine, hydrocodone)  I have personally obtained a history, examined the patient, evaluated laboratory and imaging results, formulated the assessment and plan and placed orders.  Alyson Reedy, M.D. [facility] Pulmonary/Critical Care Medicine. Pager: [phone]. After hours pager: [phone].

## 2014-01-16 NOTE — Progress Notes (Signed)
Pt seen and examined. No issues overnight. Sitting in bedside chair, says back pain is improved. Had increased PVR yesterday and Foley was placed.  EXAM: Temp:  [98.2 F (36.8 C)-99.1 F (37.3 C)] 98.3 F (36.8 C) (08/29 0834) Pulse Rate:  [29-79] 79 (08/29 0800) Resp:  [13-26] 14 (08/29 0800) BP: (110-172)/(42-89) 169/61 mmHg (08/29 0800) SpO2:  [92 %-100 %] 97 % (08/29 0800) Weight:  [71.6 kg (157 lb 13.6 oz)] 71.6 kg (157 lb 13.6 oz) (08/28 1100) Intake/Output     08/28 0701 - 08/29 0700 08/29 0701 - 08/30 0700   P.O. 200    I.V. (mL/kg) 150 (2.1)    Total Intake(mL/kg) 350 (4.9)    Urine (mL/kg/hr) 1720 (1) 60 (0.3)   Total Output 1720 60   Net -1370 -60         Awake, alert, slightly confused Moving all extremities with good strength Sensation normal  LABS: Lab Results  Component Value Date   CREATININE 1.04 01/16/2014   BUN 30* 01/16/2014   NA 139 01/16/2014   K 3.8 01/16/2014   CL 103 01/16/2014   CO2 25 01/16/2014   Lab Results  Component Value Date   WBC 10.0 01/16/2014   HGB 14.6 01/16/2014   HCT 42.0 01/16/2014   MCV 84.8 01/16/2014   PLT 124* 01/16/2014    IMPRESSION: - 80 y.o. female s/p fall with T11 fracture and thoracic EDH without spinal cord compression. Neurologically well but with inc PVR yesterday  PLAN: - Cont TLSO brace when up - Keep foley for now, will attempt trial of void again tomorrow - Will transfer to floor today

## 2014-01-17 DIAGNOSIS — I10 Essential (primary) hypertension: Secondary | ICD-10-CM

## 2014-01-17 LAB — CBC
HEMATOCRIT: 41.9 % (ref 36.0–46.0)
Hemoglobin: 14.6 g/dL (ref 12.0–15.0)
MCH: 29.4 pg (ref 26.0–34.0)
MCHC: 34.8 g/dL (ref 30.0–36.0)
MCV: 84.3 fL (ref 78.0–100.0)
PLATELETS: 117 10*3/uL — AB (ref 150–400)
RBC: 4.97 MIL/uL (ref 3.87–5.11)
RDW: 14 % (ref 11.5–15.5)
WBC: 9.6 10*3/uL (ref 4.0–10.5)

## 2014-01-17 LAB — BASIC METABOLIC PANEL
ANION GAP: 12 (ref 5–15)
BUN: 33 mg/dL — AB (ref 6–23)
CO2: 26 meq/L (ref 19–32)
Calcium: 9 mg/dL (ref 8.4–10.5)
Chloride: 100 mEq/L (ref 96–112)
Creatinine, Ser: 1.01 mg/dL (ref 0.50–1.10)
GFR calc Af Amer: 59 mL/min — ABNORMAL LOW (ref >90)
GFR calc non Af Amer: 51 mL/min — ABNORMAL LOW (ref >90)
Glucose, Bld: 112 mg/dL — ABNORMAL HIGH (ref 70–99)
Potassium: 4.4 mEq/L (ref 3.7–5.3)
Sodium: 138 mEq/L (ref 137–147)

## 2014-01-17 LAB — MAGNESIUM: Magnesium: 2.4 mg/dL (ref 1.5–2.5)

## 2014-01-17 LAB — PHOSPHORUS: Phosphorus: 2.1 mg/dL — ABNORMAL LOW (ref 2.3–4.6)

## 2014-01-17 MED ORDER — LISINOPRIL 20 MG PO TABS
20.0000 mg | ORAL_TABLET | Freq: Every day | ORAL | Status: DC
  Administered 2014-01-18: 20 mg via ORAL
  Filled 2014-01-17: qty 1

## 2014-01-17 MED ORDER — SODIUM CHLORIDE 0.9 % IJ SOLN
10.0000 mL | INTRAMUSCULAR | Status: DC | PRN
  Administered 2014-01-17: 30 mL

## 2014-01-17 NOTE — Progress Notes (Signed)
PULMONARY / CRITICAL CARE MEDICINE   Name: Rebecca Reed MRN: [id number] DOB: 1932/03/29    ADMISSION DATE:  01/14/2014 CONSULTATION DATE:  01/14/2014  REFERRING MD :  Venetia Maxon  CHIEF COMPLAINT:  Fall  INITIAL PRESENTATION: 80 yo female from ALF who presented to [facility] ED 8/27 s/p fall 8/27 AM. MRI t-spine showed burst fracture of T11 and epidural hematoma without severe cord compression. She was transferred to [facility] for neurosurgical evaluation. PCCM asked to consult for medical management.   STUDIES:  8/27 T spine MRI >stable burst freacture of T11 with epidural hematoma from T8-T11. 8/27 CT head > No acute intracranial abnormality, chronic age related atrophy  SIGNIFICANT EVENTS: 8/27 fall at ALF, to ED T11 fracture and epidural hematoma, to [facility] NICU.   SUBJECTIVE:  No events overnight  VITAL SIGNS: Temp:  [97.5 F (36.4 C)-98.9 F (37.2 C)] 97.5 F (36.4 C) (08/30 1002) Pulse Rate:  [70-85] 75 (08/30 1002) Resp:  [18-20] 20 (08/30 1002) BP: (124-180)/(51-93) 124/51 mmHg (08/30 1002) SpO2:  [94 %-99 %] 96 % (08/30 1002)  On RA   INTAKE / OUTPUT:  Intake/Output Summary (Last 24 hours) at 01/17/14 1046 Last data filed at 01/17/14 1002  Gross per 24 hour  Intake    820 ml  Output    830 ml  Net    -10 ml   PHYSICAL EXAMINATION: General:  Elderly female, overweight, in mild distress Neuro:  Alert, oriented. Some confusion, mild dementia baseline. Responds and follows commands appropriately. Good sensation and strength to BLE.  HEENT:  [place]/AT, PERRL. No JVD noted.  Cardiovascular:  Tachy, regular rhythm Lungs:  Respirations even, unlabored. Clear anterior breath sounds bilaterally.  Abdomen:  Soft, non-distended. Normoactive BS Musculoskeletal:  No acute deformity. ROM limited by back pain.  Skin:  Intact  LABS:  CBC  Recent Labs Lab 01/15/14 0430 01/16/14 0238 01/17/14 0404  WBC 9.7 10.0 9.6  HGB 14.9 14.6 14.6  HCT 42.9 42.0 41.9  PLT 114* 124* 117*    Coag's  Recent Labs Lab 01/15/14 0430  INR 1.14   BMET  Recent Labs Lab 01/15/14 0430 01/16/14 0238 01/17/14 0404  NA 140 139 138  K 3.4* 3.8 4.4  CL 101 103 100  CO2 BUN 19 30* 33*  CREATININE 0.87 1.04 1.01  GLUCOSE 173* 127* 112*   Electrolytes  Recent Labs Lab 01/15/14 0430 01/16/14 0238 01/17/14 0404  CALCIUM 8.9 9.2 9.0  MG  --  2.5 2.4  PHOS  --  2.3 2.1*   Sepsis Markers No results found for this basename: LATICACIDVEN, PROCALCITON, O2SATVEN,  in the last 168 hours ABG No results found for this basename: PHART, PCO2ART, PO2ART,  in the last 168 hours Liver Enzymes No results found for this basename: AST, ALT, ALKPHOS, BILITOT, ALBUMIN,  in the last 168 hours Cardiac Enzymes  Recent Labs Lab 01/15/14 0430 01/15/14 1120  TROPONINI <0.30 <0.30   Glucose No results found for this basename: GLUCAP,  in the last 168 hours  Imaging No results found. ASSESSMENT / PLAN:  PULMONARY A: No acute issues  P:   Off oxygen  CARDIOVASCULAR A:  Hypertension - probably pain response improved  LBBB on ECG, unclear if this in new finding.  H/o HTN  P:  PRN hydralazine for SBP > Continue preadmission antihypertensives (HCTZ, lisinopril) but increase lisinopril to home dose of  per day Continue preadmission statin  RENAL A:   Hypokalemia Resolved  P:   KVO IVF Replace electrolytes as indicated Follow Bmet  GASTROINTESTINAL A:   No acute issues  P:   Regular Diet (low salt) PO PPI for SUP  HEMATOLOGIC A:   Thrombocytopenia - appears chronic  P:  Follow CBC VTE ppx: SCDs  INFECTIOUS A:   No acute issues  P:   Monitor clinically  ENDOCRINE A:   No acute issues   P:   Monitor glucose with Bmet  NEUROLOGIC A:   T11 burst fracture with epidural hematoma T8-T11 Back pain H/o mild dementia reported  P:   IV pain control per Neurosurgery (IV morphine, hydrocodone)  Needs foley removed.     Overall doing well medically  Ok to d/c to rehab anytime.   PCCM will sign off, call again prn.   Cont current meds to d/c   Dorcas Carrow Beeper  [phone]  Cell  [phone]  If no response or cell goes to voicemail, call beeper [phone]  01/17/2014 10:55 AM

## 2014-01-17 NOTE — Progress Notes (Signed)
No issues overnight. Pt has no significant pain while still. Some back pain when moving.  EXAM:  BP 124/51  Pulse 75  Temp(Src) 97.5 F (36.4 C) (Oral)  Resp 20  Ht  (1.6 m)  Wt 71.6 kg (157 lb 13.6 oz)  BMI 27.97 kg/m2  SpO2 96%  Awake, alert Speech fluent, appropriate  Good strength BLE  IMPRESSION:  80 y.o. female with T11 fracture, neurologically intact  PLAN: - TLSO brace when up - Pain control - Will d/c foley tomorrow for trial of void

## 2014-01-17 NOTE — Evaluation (Signed)
Occupational Therapy Evaluation Patient Details Name: Rebecca Reed MRN: [id number] DOB: [birth year] Today's Date: 01/17/2014    History of Present Illness 80 yo female from ALF who presented to [facility] ED 8/27 s/p fall 8/27 AM. MRI t-spine showed burst fracture of T11 and epidural hematoma without severe cord compression. She was transferred to [facility] for neurosurgical evaluation.   Clinical Impression   Pt admitted with above. Pt independent with ADLs, PTA. Feel pt will benefit from acute OT to increase independence prior to d/c. Recommending SNF for continued rehab prior to d/c back to independent living.     Follow Up Recommendations  SNF;Supervision/Assistance - 24 hour    Equipment Recommendations  Other (comment) (defer to next venue)    Recommendations for Other Services       Precautions / Restrictions Precautions Precautions: Back Precaution Booklet Issued: Yes (comment) Precaution Comments: Reviewed precautions with pt Required Braces or Orthoses: Spinal Brace Spinal Brace: Thoracolumbosacral orthotic;Applied in supine position Restrictions Weight Bearing Restrictions: No      Mobility Bed Mobility Overal bed mobility: Needs Assistance;+2 for physical assistance Bed Mobility: Rolling;Sidelying to Sit Rolling: Supervision Sidelying to sit: Mod assist       General bed mobility comments: cues for technique. Assist with trunk to sit EOB.  Transfers Overall transfer level: Needs assistance Equipment used: Rolling walker (2 wheeled) Transfers: Sit to/from Stand Sit to Stand: Min guard         General transfer comment: cues for technique.    Balance                                            ADL Overall ADL's : Needs assistance/impaired     Grooming: Wash/dry hands;Wash/dry face;Oral care;Min guard;Standing           Upper Body Dressing : Moderate assistance (Total A for back brace; Setup/supervision for shirt)       Toilet  Transfer: Min guard;Ambulation;RW (chair)           Functional mobility during ADLs: Min guard;Rolling walker General ADL Comments: Educated on use of cup for teeth care and cues for precautions during grooming tasks at sink. Briefly showed pt pieces of AE for LB ADLs. Educated on back brace wear. Gave pt handout and wrote a few things on it as well.     Vision                     Perception     Praxis      Pertinent Vitals/Pain Pain Assessment: 0-10 Pain Score: 3  Pain Location: back Pain Intervention(s): Repositioned;Monitored during session     Hand Dominance Right   Extremity/Trunk Assessment Upper Extremity Assessment Upper Extremity Assessment: Overall WFL for tasks assessed   Lower Extremity Assessment Lower Extremity Assessment: Defer to PT evaluation       Communication Communication Communication: No difficulties   Cognition Arousal/Alertness: Awake/alert Behavior During Therapy: WFL for tasks assessed/performed Overall Cognitive Status: History of cognitive impairments - at baseline                     General Comments       Exercises       Shoulder Instructions      Home Living Family/patient expects to be discharged to:: Assisted living  Home Equipment: Grab bars - toilet;Grab bars - tub/shower          Prior Functioning/Environment Level of Independence: Independent             OT Diagnosis: Acute pain   OT Problem List: Decreased strength;Decreased range of motion;Decreased activity tolerance;Decreased knowledge of use of DME or AE;Decreased knowledge of precautions;Pain   OT Treatment/Interventions: Self-care/ADL training;DME and/or AE instruction;Patient/family education;Balance training;Therapeutic activities;Cognitive remediation/compensation    OT Goals(Current goals can be found in the care plan section) Acute Rehab OT Goals Patient Stated Goal: not stated OT Goal  Formulation: With patient Time For Goal Achievement: 02/24/14 Potential to Achieve Goals: Good ADL Goals Pt Will Perform Lower Body Dressing: with min guard assist;with adaptive equipment;sit to/from stand Pt Will Transfer to Toilet: ambulating;with modified independence Pt Will Perform Toileting - Clothing Manipulation and hygiene: with modified independence;sit to/from stand  OT Frequency: Min 2X/week   Barriers to D/C:            Co-evaluation              End of Session Equipment Utilized During Treatment: Gait belt;Rolling walker;Back brace Nurse Communication: Mobility status;Other (comment) (pt dizzy)  Activity Tolerance: Patient tolerated treatment well;Other (comment) (became dizzy) Patient left: in chair;with call bell/phone within reach;with chair alarm set   Time: [phone] OT Time Calculation (min): 30 min Charges:  OT General Charges $OT Visit: 1 Procedure OT Evaluation $Initial OT Evaluation Tier I: 1 Procedure OT Treatments $Self Care/Home Management : 8-22 mins G-CodesEarlie Reed OTR/L [phone] 01/17/2014, 2:11 PM

## 2014-01-18 MED ORDER — HYDROCODONE-ACETAMINOPHEN 5-325 MG PO TABS: 1.0000 | ORAL_TABLET | ORAL | Status: DC | PRN

## 2014-01-18 NOTE — Clinical Social Work Psychosocial (Signed)
Clinical Social Work Department BRIEF PSYCHOSOCIAL ASSESSMENT 01/18/2014  Patient:  Rebecca Reed, Rebecca Reed     Account Number:  [id number]     Admit date:  01/14/2014  Clinical Social Worker:  Delrae Sawyers  Date/Time:  01/18/2014 11:40 AM  Referred by:  Physician  Date Referred:  01/18/2014 Referred for  SNF Placement   Other Referral:   none.   Interview type:  Family Other interview type:   CSW spoke with pt's husband, Trilby Drummer, at bedside.    PSYCHOSOCIAL DATA Living Status:  FACILITY Admitted from facility:  [facility] Level of care:  Independent Living Primary support name:  Yvone Slape Primary support relationship to patient:  SPOUSE Degree of support available:   Strong support system.    CURRENT CONCERNS Current Concerns  Post-Acute Placement   Other Concerns:   none.    SOCIAL WORK ASSESSMENT / PLAN CSW received referral for SNF placement at time of discharge. CSW met with pt and pt's husband at bedside to discuss discharge disposition. Pt stated pt and pt's husband have lived at [facility] for the past year. Pt's husband expressed concern for caring for pt at pt's current medical state in the home, and stated interest in short-term rehabilitation.    CSW contacted [facility] and provided clinical information. Per [facility] admissions liaison, pt is able to return to [facility] in the skilled side of facility for short-term rehabilitation.    CSW to continue to follow and asist with discharge planning needs.   Assessment/plan status:  Psychosocial Support/Ongoing Assessment of Needs Other assessment/ plan:   none.   Information/referral to community resources:   Pt returning to [facility].    PATIENT'S/FAMILY'S RESPONSE TO PLAN OF CARE: Pt and pt's husband understanding and agreeable to CSW plan of care. Pt's husband expressed no further questions or concerns at this time.       Lubertha Sayres, MSW,  [facility] Licensed Clinical Social Worker [phone] and [phone]

## 2014-01-18 NOTE — Progress Notes (Signed)
Pt transported via [facility] to [facility].  VSS/Patient stable upon discharge.  Education complete. Sondra Come, RN 01/18/2014

## 2014-01-18 NOTE — Clinical Social Work Note (Signed)
CSW faxed discharge summary to [facility]. Discharge packet complete and placed on pt's shadow chart. Pt's husband, Alinda Money, updated at bedside. EMS ([facility]) arranged for transportation.  RN to call report to [facility] at [phone] (Room 227)  Marcelline Deist, MSW, [facility] Licensed Clinical Social Worker [phone] and [phone]

## 2014-01-18 NOTE — Progress Notes (Signed)
Subjective: Patient reports still confused, complains of back pain with mobility  Objective: Vital signs in last 24 hours: Temp:  [97.5 F (36.4 C)-98.4 F (36.9 C)] 98.2 F (36.8 C) (08/31 0700) Pulse Rate:  [64-77] 77 (08/31 0700) Resp:  [18-22] 22 (08/31 0700) BP: (121-160)/(51-96) 154/96 mmHg (08/31 0700) SpO2:  [93 %-99 %] 96 % (08/31 0700)  Intake/Output from previous day: 08/30 0701 - 08/31 0700 In: 840 [P.O.:840] Out: 275 [Urine:275] Intake/Output this shift:    Physical Exam: Strength full.  Voiding since Foley discontinued.  Lab Results:  Recent Labs  01/16/14 0238 01/17/14 0404  WBC 10.0 9.6  HGB 14.6 14.6  HCT 42.0 41.9  PLT 124* 117*   BMET  Recent Labs  01/16/14 0238 01/17/14 0404  NA 139 138  K 3.8 4.4  CL 103 100  CO2 25 26  GLUCOSE 127* 112*  BUN 30* 33*  CREATININE 1.04 1.01  CALCIUM 9.2 9.0    Studies/Results: No results found.  Assessment/Plan: Continue to mobilize in TLSO.  May D/C to SNF if doing well today.  Kypholpasty may be considered if pain does not abate, but I would try trial of bracing first.  D/C paperwork completed if SW can arrange D/C back to patient's facility.    LOS: 4 days    Dorian Heckle, MD 01/18/2014, 7:31 AM

## 2014-01-18 NOTE — Progress Notes (Signed)
Discharge orders received.  Report called to [facility].  Awaiting [facility] for transport.  Will continue to monitor. Sondra Come, RN 01/18/2014 3:11 PM

## 2014-01-18 NOTE — Care Management Note (Addendum)
  Page 1 of 1   01/18/2014     3:18:49 PM CARE MANAGEMENT NOTE 01/18/2014  Patient:  Rebecca Reed, Rebecca Reed   Account Number:  [id number]  Date Initiated:  01/18/2014  Documentation initiated by:  Elmer Bales  Subjective/Objective Assessment:   Patient was admitted with epidural hematoma. Resides at [facility].     Action/Plan:   Will follow for discharge needs pending PT/OT evals and physician orders.   Anticipated DC Date:  01/18/2014   Anticipated DC Plan:  SKILLED NURSING FACILITY  In-house referral  Clinical Social Worker         Choice offered to / List presented to:             Status of service:  In process, will continue to follow Medicare Important Message given?  YES (If response is "NO", the following Medicare IM given date fields will be blank) Date Medicare IM given:  01/18/2014 Medicare IM given by:  Elmer Bales Date Additional Medicare IM given:   Additional Medicare IM given by:    Discharge Disposition:    Per UR Regulation:    If discussed at Long Length of Stay Meetings, dates discussed:    Comments:  01/18/14 1325 Elmer Bales RN, MSN, CM- Medicare IM letter provided.

## 2014-01-18 NOTE — Progress Notes (Signed)
Physical Therapy Treatment Patient Details Name: GUSTAVO MEDITZ MRN: [id number] DOB: [birth year] Today's Date: 01/18/2014    History of Present Illness 80 yo female from ALF who presented to [facility] ED 8/27 s/p fall 8/27 AM. MRI t-spine showed burst fracture of T11 and epidural hematoma without severe cord compression. She was transferred to [facility] for neurosurgical evaluation.    PT Comments     Progressing well,  Emphasis on education and mobilizing with the RW.   Follow Up Recommendations  SNF;Supervision/Assistance - 24 hour     Equipment Recommendations  Rolling walker with 5" wheels    Recommendations for Other Services       Precautions / Restrictions Precautions Precautions: Back Precaution Booklet Issued: Yes (comment) Precaution Comments: Reviewed precautions with pt Required Braces or Orthoses: Spinal Brace Spinal Brace: Thoracolumbosacral orthotic;Applied in supine position Restrictions Weight Bearing Restrictions: No    Mobility  Bed Mobility Overal bed mobility: Needs Assistance Bed Mobility: Rolling;Sidelying to Sit Rolling: Supervision Sidelying to sit: Mod assist       General bed mobility comments: cues for technique, reinforced log roll and coming up from the side.  Needed truncal assist to come up due to the pain  Transfers Overall transfer level: Needs assistance Equipment used: Rolling walker (2 wheeled) Transfers: Sit to/from Stand Sit to Stand: Min assist Stand pivot transfers: Min assist       General transfer comment: cues for technique.  Ambulation/Gait Ambulation/Gait assistance: Min guard;Min assist Ambulation Distance (Feet): 170 Feet Assistive device: Rolling walker (2 wheeled) Gait Pattern/deviations: Step-through pattern     General Gait Details: generally steady   Stairs            Wheelchair Mobility    Modified Rankin (Stroke Patients Only)       Balance Overall balance assessment: No apparent balance  deficits (not formally assessed)                                  Cognition Arousal/Alertness: Awake/alert Behavior During Therapy: WFL for tasks assessed/performed Overall Cognitive Status: History of cognitive impairments - at baseline Area of Impairment: Memory;Safety/judgement         Safety/Judgement: Decreased awareness of safety;Decreased awareness of deficits   Problem Solving: Difficulty sequencing;Requires verbal cues;Requires tactile cues      Exercises      General Comments General comments (skin integrity, edema, etc.): Reinforced all back care and precautions, logroll, transitions sit to/from standing, bracing issues for the TLSO, and progression of activity.      Pertinent Vitals/Pain Pain Assessment: 0-10 Pain Score: 4  Faces Pain Scale: Hurts little more    Home Living                      Prior Function            PT Goals (current goals can now be found in the care plan section) Acute Rehab PT Goals PT Goal Formulation: With patient Time For Goal Achievement: 01/29/14 Potential to Achieve Goals: Fair Progress towards PT goals: Progressing toward goals    Frequency  Min 3X/week    PT Plan Current plan remains appropriate    Co-evaluation             End of Session Equipment Utilized During Treatment: Back brace Activity Tolerance: Patient limited by pain;Patient tolerated treatment well Patient left: in chair;with call bell/phone within reach  Time: 1610-9604 PT Time Calculation (min): 38 min  Charges:  $Gait Training: 8-22 mins $Therapeutic Activity: 8-22 mins $Self Care/Home Management: 2023/01/19                    G Codes:      Vashaun Osmon, Eliseo Gum 01/18/2014, 3:53 PM 01/18/2014  Bartow Bing, PT [phone]  (pager)

## 2014-01-18 NOTE — Discharge Summary (Signed)
Physician Discharge Summary  Patient ID: Rebecca Reed MRN: [id number] DOB/AGE: [birth year] 80 y.o.  Admit date: 01/14/2014 Discharge date: 01/18/2014  Admission Diagnoses:T 11 fracture with epidural hematoma, urinary retantion  Discharge Diagnoses: Same  Active Problems:   Closed fracture of thoracic vertebra without spinal cord injury   Essential hypertension, benign   Hypokalemia   Discharged Condition: fair  Hospital Course: Patient observed in ICU without apparent neurologic deficit, mobilized in TLSO, transferred to floor, mobilized with PT, treated for hypokalemia and urinary retention resolved, transferred to SNF from which she had come.  Consults: None  Significant Diagnostic Studies: None  Treatments: Bracing  Discharge Exam: Blood pressure 154/96, pulse 77, temperature 98.2 F (36.8 C), temperature source Oral, resp. rate 22, height  (1.6 m), weight 71.6 kg (157 lb 13.6 oz), SpO2 96.00%. Neurologic: Alert and oriented X 3, normal strength and tone. Normal symmetric reflexes. Normal coordination and gait  Disposition: SNF     Medication List         CALCIUM 600 + D 600-200 MG-UNIT Tabs  Generic drug:  Calcium Carb-Cholecalciferol  Take 1 tablet by mouth daily.     donepezil 5 MG tablet  Commonly known as:  ARICEPT  Take 5 mg by mouth at bedtime.     hydrochlorothiazide 25 MG tablet  Commonly known as:  HYDRODIURIL  Take 25 mg by mouth daily.     HYDROcodone-acetaminophen 5-325 MG per tablet  Commonly known as:  NORCO/VICODIN  Take 1-2 tablets by mouth every 4 (four) hours as needed for moderate pain.     lisinopril 20 MG tablet  Commonly known as:  PRINIVIL,ZESTRIL  Take 20 mg by mouth daily.     multivitamin with minerals Tabs tablet  Take 1 tablet by mouth daily.     simvastatin 10 MG tablet  Commonly known as:  ZOCOR  Take 10 mg by mouth every morning.         Signed: Dorian Heckle, MD 01/18/2014, 7:39 AM

## 2014-01-19 DIAGNOSIS — S82843A Displaced bimalleolar fracture of unspecified lower leg, initial encounter for closed fracture: Secondary | ICD-10-CM

## 2014-01-19 DIAGNOSIS — D696 Thrombocytopenia, unspecified: Secondary | ICD-10-CM

## 2014-01-19 DIAGNOSIS — E785 Hyperlipidemia, unspecified: Secondary | ICD-10-CM

## 2014-01-19 DIAGNOSIS — I10 Essential (primary) hypertension: Secondary | ICD-10-CM

## 2014-01-19 DIAGNOSIS — G3184 Mild cognitive impairment, so stated: Secondary | ICD-10-CM

## 2014-01-19 DIAGNOSIS — IMO0002 Reserved for concepts with insufficient information to code with codable children: Secondary | ICD-10-CM

## 2014-03-03 ENCOUNTER — Encounter (HOSPITAL_COMMUNITY): Payer: Self-pay | Admitting: Pharmacy Technician

## 2014-03-03 ENCOUNTER — Other Ambulatory Visit: Payer: Self-pay | Admitting: Neurosurgery

## 2014-03-10 ENCOUNTER — Ambulatory Visit (HOSPITAL_COMMUNITY)
Admission: RE | Admit: 2014-03-10 | Discharge: 2014-03-10 | Disposition: A | Discharge status: Home / Self Care | Payer: Medicare Other | Source: Ambulatory Visit | Attending: Neurosurgery | Admitting: Neurosurgery

## 2014-03-10 ENCOUNTER — Encounter (HOSPITAL_COMMUNITY): Payer: Self-pay

## 2014-03-10 ENCOUNTER — Encounter (HOSPITAL_COMMUNITY)
Admission: RE | Admit: 2014-03-10 | Discharge: 2014-03-10 | Disposition: A | Discharge status: Home / Self Care | Payer: Medicare Other | Source: Ambulatory Visit | Attending: Neurosurgery | Admitting: Neurosurgery

## 2014-03-10 DIAGNOSIS — I10 Essential (primary) hypertension: Secondary | ICD-10-CM | POA: Diagnosis not present

## 2014-03-10 DIAGNOSIS — M4854XA Collapsed vertebra, not elsewhere classified, thoracic region, initial encounter for fracture: Secondary | ICD-10-CM | POA: Insufficient documentation

## 2014-03-10 DIAGNOSIS — E785 Hyperlipidemia, unspecified: Secondary | ICD-10-CM | POA: Insufficient documentation

## 2014-03-10 DIAGNOSIS — Z01818 Encounter for other preprocedural examination: Secondary | ICD-10-CM | POA: Insufficient documentation

## 2014-03-10 DIAGNOSIS — D696 Thrombocytopenia, unspecified: Secondary | ICD-10-CM | POA: Diagnosis not present

## 2014-03-10 HISTORY — DX: Unspecified osteoarthritis, unspecified site: M19.90

## 2014-03-10 LAB — CBC
HCT: 38.1 % (ref 36.0–46.0)
HEMOGLOBIN: 12.7 g/dL (ref 12.0–15.0)
MCH: 28.9 pg (ref 26.0–34.0)
MCHC: 33.3 g/dL (ref 30.0–36.0)
MCV: 86.6 fL (ref 78.0–100.0)
Platelets: 147 10*3/uL — ABNORMAL LOW (ref 150–400)
RBC: 4.4 MIL/uL (ref 3.87–5.11)
RDW: 13.6 % (ref 11.5–15.5)
WBC: 4.8 10*3/uL (ref 4.0–10.5)

## 2014-03-10 LAB — BASIC METABOLIC PANEL
Anion gap: 12 (ref 5–15)
BUN: 20 mg/dL (ref 6–23)
CALCIUM: 9.8 mg/dL (ref 8.4–10.5)
CO2: 29 mEq/L (ref 19–32)
CREATININE: 0.98 mg/dL (ref 0.50–1.10)
Chloride: 103 mEq/L (ref 96–112)
GFR, EST AFRICAN AMERICAN: 61 mL/min — AB (ref >90)
GFR, EST NON AFRICAN AMERICAN: 53 mL/min — AB (ref >90)
Glucose, Bld: 95 mg/dL (ref 70–99)
Potassium: 4 mEq/L (ref 3.7–5.3)
Sodium: 144 mEq/L (ref 137–147)

## 2014-03-10 LAB — SURGICAL PCR SCREEN
MRSA, PCR: NEGATIVE
Staphylococcus aureus: NEGATIVE

## 2014-03-10 MED ORDER — CEFAZOLIN SODIUM-DEXTROSE 2-3 GM-% IV SOLR: 2.0000 g | INTRAVENOUS | Status: DC

## 2014-03-10 NOTE — Progress Notes (Signed)
Anesthesia Chart Review:  Pt is 80 year old female scheduled for T12 kyphoplasty on 03/11/2014 with Dr. Venetia MaxonStern.   PMH: HTN, hyperlipidemia, thrombocytopenia  Medications include: ASA, amlodipine, lisinopril, simvastatin, aricept  Preoperative labs reviewed.    Chest x-ray reviewed.  1. No active cardiopulmonary disease.  2. T11 body fracture with progressive height loss since 01/14/2014.  EKG 01/14/2014. NSR. LBBB. No previous EKG available.   EKG is from when pt was admitted for T11 fracture. Progress notes indicate uncertainty if LBBB is new or not.   Spoke with Dr. Randa EvensEdwards. Pt will need cardiology work up prior to surgery.   Lindwood Cokealled [name] with Dr. Fredrich BirksStern's office to notify of clearance need.   Rica Mastngela Kabbe, FNP-BC [facility] Short [facility] Surgical [facility]/Anesthesiology Phone: [phone] 03/10/2014 4:36 PM

## 2014-03-10 NOTE — Pre-Procedure Instructions (Signed)
Rebecca Reed  03/10/2014   Your procedure is scheduled on:   Thursday  03/11/14  [facility]mitting at 1:15 PM.  Call this number if you have problems the morning of surgery: [phone]   Remember:   Do not eat food or drink liquids after midnight.   Take these medicines the morning of surgery with A SIP OF WATER:  TYLENOL, AMLODIPINE (NORVASC)    (STOP ASPIRIN, COUMADIN, PLAVIX, EFFIENT, HERBAL MEDICINES)   Do not wear jewelry, make-up or nail polish.  Do not wear lotions, powders, or perfumes. You may wear deodorant.  Do not shave 48 hours prior to surgery. Men may shave face and neck.  Do not bring valuables to the hospital.  [facility] is not responsible                  for any belongings or valuables.               Contacts, dentures or bridgework may not be worn into surgery.  Leave suitcase in the car. After surgery it may be brought to your room.  For patients admitted to the hospital, discharge time is determined by your                treatment team.               Patients discharged the day of surgery will not be allowed to drive  home.  Name and phone number of your driver:   Special Instructions:  Special Instructions: [place] - Preparing for Surgery  Before surgery, you can play an important role.  Because skin is not sterile, your skin needs to be as free of germs as possible.  You can reduce the number of germs on you skin by washing with CHG (chlorahexidine gluconate) soap before surgery.  CHG is an antiseptic cleaner which kills germs and bonds with the skin to continue killing germs even after washing.  Please DO NOT use if you have an allergy to CHG or antibacterial soaps.  If your skin becomes reddened/irritated stop using the CHG and inform your nurse when you arrive at Short Stay.  Do not shave (including legs and underarms) for at least 48 hours prior to the first CHG shower.  You may shave your face.  Please follow these  instructions carefully:   1.  Shower with CHG Soap the night before surgery and the morning of Surgery.  2.  If you choose to wash your hair, wash your hair first as usual with your normal shampoo.  3.  After you shampoo, rinse your hair and body thoroughly to remove the Shampoo.  4.  Use CHG as you would any other liquid soap. You can apply chg directly to the skin and wash gently with scrungie or a clean washcloth.  5.  Apply the CHG Soap to your body ONLY FROM THE NECK DOWN.  Do not use on open wounds or open sores.  Avoid contact with your eyes, ears, mouth and genitals (private parts).  Wash genitals (private parts with your normal soap.  6.  Wash thoroughly, paying special attention to the area where your surgery will be performed.  7.  Thoroughly rinse your body with warm water from the neck down.  8.  DO NOT shower/wash with your normal soap after using and rinsing off the CHG Soap.  9.  Pat yourself dry with a clean towel.  10.  Wear clean pajamas.            11.  Place clean sheets on your bed the night of your first shower and do not sleep with pets.  Day of Surgery  Do not apply any lotions/deodorants the morning of surgery.  Please wear clean clothes to the hospital/surgery center.   Please read over the following fact sheets that you were given: Pain Booklet, Coughing and Deep Breathing, MRSA Information and Surgical Site Infection Prevention

## 2014-03-11 ENCOUNTER — Inpatient Hospital Stay (HOSPITAL_COMMUNITY): Admission: RE | Admit: 2014-03-11 | Payer: Medicare Other | Source: Ambulatory Visit | Admitting: Neurosurgery

## 2014-03-11 ENCOUNTER — Encounter (HOSPITAL_COMMUNITY): Admission: RE | Payer: Self-pay | Source: Ambulatory Visit

## 2014-03-11 SURGERY — KYPHOPLASTY
Anesthesia: General

## 2014-03-15 ENCOUNTER — Other Ambulatory Visit: Payer: Self-pay | Admitting: Neurosurgery

## 2014-03-15 ENCOUNTER — Ambulatory Visit: Payer: Self-pay | Admitting: Cardiology

## 2014-03-16 NOTE — Progress Notes (Signed)
Anesthesia follow-up:  See note from Rica MastAngela Kabbe, FNP-C from 03/10/14.  Since then patient underwent a nuclear stress test by [facility]Fath [facility].  Results on 03/15/14 [facility] showed:Normal Lexiscan sestimibi.  LV global function was normal. Myocardial perfusion scan showed no evidence of pathology and has a normal appearance.  No significant ischemia.  No significant wall motion abnormality noted.  Estimated EF 76%. No EKG changes concerning for ischemia.  Overall, this is a low risk scan. Patient was subsequently cleared by Dr. Lady GaryFath for surgery with routine cardio-pulmonary monitoring.  Surgery has been rescheduled for 03/19/14.  Velna Ochsllison Silvana Holecek, PA-C [facility] Short Stay Center/Anesthesiology Phone [phone] 03/16/2014 9:32 AM

## 2014-03-18 NOTE — Progress Notes (Signed)
Spoke with patient and her husband to verify time of arrival (1:15 PM) for surgery tomorrow and to make sure they didn't have any questions. They still have the instructions from her PAT appt and know to follow those.

## 2014-03-18 NOTE — H&P (Signed)
[address] [place], [place] [address] Phone: [phone]   Patient ID:   [id number] Patient: Rebecca Reed  Date of Birth: 16-Nov-1931 Visit Type: Office Visit   Date: 03/03/2014 10:45 AM Provider: Danae OrleansJoseph D. Venetia MaxonStern MD   This 80 year old female presents for Follow Up of back pain.  History of Present Illness: 1.  Follow Up of back pain  Pt returns for follow up re: T12 Fx.  Pain continues to decrease. She is hopeful brace can be discontinued.   X-ray on [place]  Repeat radiographs show progressive collapse of T12 with increased kyphosis.  Patient has not had any resolution of pain.  Based on these findings I have recommended proceeding with T12 kyphoplasty.  She has been compliant with wearing her brace.      Medical/Surgical/Interim History Reviewed, no change.  Last detailed document date:02/01/2014.   PAST MEDICAL HISTORY, SURGICAL HISTORY, FAMILY HISTORY, SOCIAL HISTORY AND REVIEW OF SYSTEMS I have reviewed the patient's past medical, surgical, family and social history as well as the comprehensive review of systems as included on the [facility] history form dated 02/01/2014, which I have signed.  Family History: Reviewed, no changes.  Last detailed document: 02/01/2014.  Patient reports there is no relevant family history.   Social History: Tobacco use reviewed. Reviewed, no changes. Last detailed document date: 02/01/2014.      MEDICATIONS(added, continued or stopped this visit):   Started Medication Directions Instruction Stopped   Bactrim DS 800 mg-160 mg tablet take 1 tablet by oral route  every 12 hours     hydrocodone 5 mg-acetaminophen 325 mg tablet take 1 - 2 tablet by oral route  every 4 hours as needed for pain     lisinopril 20 mg tablet take 1 tablet by oral route  every day     multivitamin tablet Take 1 tablet daily     Senna-S 8.6 mg-50 mg tablet take 2 tablet by oral route  every day      simvastatin 10 mg tablet take 1 tablet by oral route  every day in the evening     Vitamin D3 2,000 unit capsule Take 1 tablet daily      ALLERGIES:  Ingredient Reaction Medication Name Comment  NO KNOWN ALLERGIES     No known allergies. Reviewed, no changes.    Vitals Date Temp F BP Pulse Ht In Wt Lb BMI BSA Pain Score  03/03/2014  164/75 66 63 157 27.81  0/10        IMPRESSION Progressive collapse of T12 with progressive anterior height loss  Completed Orders (this encounter) Order Details Reason Side Interpretation Result Initial Treatment Date Region  Lifestyle education regarding diet Encouraged to eat a well balanced diet and follow up with primary care physician.        Hypertension education Follow up with primary care physician.         Assessment/Plan # Detail Type Description   1. Assessment Closed wedge compression fracture of twelfth thoracic vertebra, sequela (S22.080S).       2. Assessment Osteoporosis (M81.0).       3. Assessment Unsp fracture of T11-T12 vertebra, subs for fx w routn heal (S22.089D).       4. Assessment Body mass index (BMI) 27.0-27.9, adult (M84.13(Z68.27).   Plan Orders Today's instructions / counseling include(s) Lifestyle education regarding diet.       5. Assessment Essential (primary) hypertension (I10).  Pain Assessment/Treatment Pain Scale: 0/10. Method: Numeric Pain Intensity Scale.  Fall Risk Plan The patient has not fallen in the last year.  T12 kyphoplasty procedure.  Risks and benefits were discussed in detail with the patient and her husband and they wish to proceed.  Orders: Instruction(s)/Education: Assessment Instruction  I10 Hypertension education  [phone] Lifestyle education regarding diet             Provider:  Danae OrleansJoseph D. Venetia MaxonStern MD  03/07/2014 07:04 PM Dictation edited by: Danae OrleansJoseph D. Venetia MaxonStern    CC Providers: Maeola HarmanJoseph Tashaun Obey MD [address] [place], [place]  [address] ----------------------------------------------------------------------------------------------------------------------------------------------------------------------         Electronically signed by Danae OrleansJoseph D. Venetia MaxonStern MD on 03/07/2014 07:04 PM   [address] [place], [place] [address] Phone: [phone]   Patient ID:   [id number] Patient: Rebecca Reed  Date of Birth: 1931-09-17 Visit Type: Office Visit   Date: 02/01/2014 11:30 AM Provider: Danae OrleansJoseph D. Venetia MaxonStern MD   This 80 year old female presents for back pain.  History of Present Illness: 1.  back pain  Patient is doing well and comes in with her husband today.  She is continuing to have problems with her memory.  She has stable radiograph of T12 fracture.  I've advised her to continue to wear the back brace and come back and see me in one month with repeat thoracolumbar AP and lateral radiographs.  If these are stable we will take her out of her brace.  She says that her pain is much improved.  Her strength is full in both lower extremities.        PAST MEDICAL/SURGICAL HISTORY   (Detailed)  Disease/disorder Onset Date Management Date Comments    Knee replacement 1998     Cholecystectomy        Family History  (Detailed) Patient reports there is no relevant family history.   SOCIAL HISTORY  (Detailed) Tobacco use reviewed. Preferred language is [place].   Smoking status: Never smoker.  SMOKING STATUS Use Status Type Smoking Status Usage Per Day Years Used Total Pack Years  no/never  Never smoker             MEDICATIONS(added, continued or stopped this visit):   Started Medication Directions Instruction Stopped   Bactrim DS 800 mg-160 mg tablet take 1 tablet by oral route  every 12 hours     hydrocodone 5 mg-acetaminophen 325 mg tablet take 1 - 2 tablet by oral route  every 4 hours as needed for pain     lisinopril 20 mg tablet take 1 tablet by oral route  every day      multivitamin tablet Take 1 tablet daily     Senna-S 8.6 mg-50 mg tablet take 2 tablet by oral route  every day     simvastatin 10 mg tablet take 1 tablet by oral route  every day in the evening     Vitamin D3 2,000 unit capsule Take 1 tablet daily      ALLERGIES:  Ingredient Reaction Medication Name Comment  NO KNOWN ALLERGIES     No known allergies.   Vitals Date Temp F BP Pulse Ht In Wt Lb BMI BSA Pain Score  02/01/2014  129/81 64 63 153 27.1  0/10        IMPRESSION Improving back pain and stable radiographs following T12 fracture  Completed Orders (this encounter) Order Details Reason Side Interpretation Result Initial Treatment Date Region  Thoraco-lumbar Spine- AP/Lat  02/01/2014    Assessment/Plan # Detail Type Description   1. Assessment Closed fracture of thoracic vertebra (805.2).       2. Assessment Osteoporosis (733.00).       3. Assessment Dementia (294.20).         Return visit one month with repeat radiographs and if she is doing well I will wean back brace.  Orders: Diagnostic Procedures: Assessment Procedure   Thoraco-lumbar Spine- AP/Lat  805.2 Thoraco-lumbar Spine- AP/Lat             Provider:  Danae Orleans. Venetia Maxon MD  02/13/2014 01:38 PM Dictation edited by: Danae Orleans. Venetia Maxon    CC Providers: Maeola Harman MD [address] [place], [place] [address] ----------------------------------------------------------------------------------------------------------------------------------------------------------------------         Electronically signed by Danae Orleans. Venetia Maxon MD on 02/13/2014 01:38 PM

## 2014-03-19 ENCOUNTER — Observation Stay (HOSPITAL_COMMUNITY)
Admission: RE | Admit: 2014-03-19 | Discharge: 2014-03-20 | Disposition: A | Discharge status: Home / Self Care | Payer: Medicare Other | Source: Ambulatory Visit | Attending: Neurosurgery | Admitting: Neurosurgery

## 2014-03-19 ENCOUNTER — Encounter (HOSPITAL_COMMUNITY): Payer: Self-pay | Admitting: Certified Registered Nurse Anesthetist

## 2014-03-19 ENCOUNTER — Inpatient Hospital Stay (HOSPITAL_COMMUNITY): Payer: Medicare Other

## 2014-03-19 ENCOUNTER — Encounter (HOSPITAL_COMMUNITY)
Admission: RE | Disposition: A | Discharge status: Home / Self Care | Payer: Self-pay | Source: Ambulatory Visit | Attending: Neurosurgery

## 2014-03-19 ENCOUNTER — Encounter (HOSPITAL_COMMUNITY): Payer: Medicare Other | Admitting: Emergency Medicine

## 2014-03-19 ENCOUNTER — Inpatient Hospital Stay (HOSPITAL_COMMUNITY): Payer: Medicare Other | Admitting: Vascular Surgery

## 2014-03-19 DIAGNOSIS — R262 Difficulty in walking, not elsewhere classified: Secondary | ICD-10-CM | POA: Insufficient documentation

## 2014-03-19 DIAGNOSIS — S22000A Wedge compression fracture of unspecified thoracic vertebra, initial encounter for closed fracture: Secondary | ICD-10-CM | POA: Diagnosis present

## 2014-03-19 DIAGNOSIS — Z79891 Long term (current) use of opiate analgesic: Secondary | ICD-10-CM | POA: Diagnosis not present

## 2014-03-19 DIAGNOSIS — Z79899 Other long term (current) drug therapy: Secondary | ICD-10-CM | POA: Insufficient documentation

## 2014-03-19 DIAGNOSIS — I10 Essential (primary) hypertension: Secondary | ICD-10-CM | POA: Insufficient documentation

## 2014-03-19 DIAGNOSIS — M1990 Unspecified osteoarthritis, unspecified site: Secondary | ICD-10-CM | POA: Diagnosis not present

## 2014-03-19 DIAGNOSIS — M4854XA Collapsed vertebra, not elsewhere classified, thoracic region, initial encounter for fracture: Principal | ICD-10-CM | POA: Insufficient documentation

## 2014-03-19 DIAGNOSIS — M4854XS Collapsed vertebra, not elsewhere classified, thoracic region, sequela of fracture: Secondary | ICD-10-CM | POA: Diagnosis not present

## 2014-03-19 DIAGNOSIS — Z87891 Personal history of nicotine dependence: Secondary | ICD-10-CM | POA: Diagnosis not present

## 2014-03-19 DIAGNOSIS — F0390 Unspecified dementia without behavioral disturbance: Secondary | ICD-10-CM | POA: Diagnosis not present

## 2014-03-19 HISTORY — PX: KYPHOPLASTY: SHX5884

## 2014-03-19 SURGERY — KYPHOPLASTY
Anesthesia: General

## 2014-03-19 MED ORDER — LIDOCAINE-EPINEPHRINE 1 %-1:100000 IJ SOLN
INTRAMUSCULAR | Status: DC | PRN
  Administered 2014-03-19: 3 mL

## 2014-03-19 MED ORDER — DOCUSATE SODIUM 100 MG PO CAPS
100.0000 mg | ORAL_CAPSULE | Freq: Two times a day (BID) | ORAL | Status: DC
  Administered 2014-03-19: 100 mg via ORAL
  Filled 2014-03-19 (×3): qty 1

## 2014-03-19 MED ORDER — ACETAMINOPHEN 325 MG PO TABS: 650.0000 mg | ORAL_TABLET | ORAL | Status: DC | PRN

## 2014-03-19 MED ORDER — DONEPEZIL HCL 5 MG PO TABS
5.0000 mg | ORAL_TABLET | Freq: Every day | ORAL | Status: DC
  Administered 2014-03-19: 5 mg via ORAL
  Filled 2014-03-19 (×2): qty 1

## 2014-03-19 MED ORDER — IOHEXOL 300 MG/ML  SOLN
INTRAMUSCULAR | Status: DC | PRN
  Administered 2014-03-19: 50 mL

## 2014-03-19 MED ORDER — ROCURONIUM BROMIDE 50 MG/5ML IV SOLN
INTRAVENOUS | Status: AC
  Filled 2014-03-19: qty 1

## 2014-03-19 MED ORDER — ONDANSETRON HCL 4 MG/2ML IJ SOLN: 4.0000 mg | INTRAMUSCULAR | Status: DC | PRN

## 2014-03-19 MED ORDER — CALCIUM CARBONATE-VITAMIN D 500-200 MG-UNIT PO TABS
1.0000 | ORAL_TABLET | Freq: Every day | ORAL | Status: DC
  Filled 2014-03-19 (×2): qty 1

## 2014-03-19 MED ORDER — ZOLPIDEM TARTRATE 5 MG PO TABS: 5.0000 mg | ORAL_TABLET | Freq: Every evening | ORAL | Status: DC | PRN

## 2014-03-19 MED ORDER — NEOSTIGMINE METHYLSULFATE 10 MG/10ML IV SOLN
INTRAVENOUS | Status: AC
  Filled 2014-03-19: qty 1

## 2014-03-19 MED ORDER — LACTATED RINGERS IV SOLN
INTRAVENOUS | Status: DC
  Administered 2014-03-19: 13:00:00 via INTRAVENOUS

## 2014-03-19 MED ORDER — MORPHINE SULFATE 2 MG/ML IJ SOLN: 1.0000 mg | INTRAMUSCULAR | Status: DC | PRN

## 2014-03-19 MED ORDER — ONDANSETRON HCL 4 MG/2ML IJ SOLN
INTRAMUSCULAR | Status: AC
  Filled 2014-03-19: qty 2

## 2014-03-19 MED ORDER — LIDOCAINE HCL (CARDIAC) 20 MG/ML IV SOLN
INTRAVENOUS | Status: DC | PRN
  Administered 2014-03-19: 40 mg via INTRAVENOUS

## 2014-03-19 MED ORDER — GLYCOPYRROLATE 0.2 MG/ML IJ SOLN
INTRAMUSCULAR | Status: AC
  Filled 2014-03-19: qty 3

## 2014-03-19 MED ORDER — LISINOPRIL 20 MG PO TABS
20.0000 mg | ORAL_TABLET | Freq: Every day | ORAL | Status: DC
  Administered 2014-03-19: 20 mg via ORAL
  Filled 2014-03-19 (×2): qty 1

## 2014-03-19 MED ORDER — SENNA 8.6 MG PO TABS
1.0000 | ORAL_TABLET | Freq: Two times a day (BID) | ORAL | Status: DC
  Administered 2014-03-19: 8.6 mg via ORAL
  Filled 2014-03-19 (×3): qty 1

## 2014-03-19 MED ORDER — PANTOPRAZOLE SODIUM 40 MG PO TBEC
40.0000 mg | DELAYED_RELEASE_TABLET | Freq: Every day | ORAL | Status: DC
  Administered 2014-03-19: 40 mg via ORAL
  Filled 2014-03-19: qty 1

## 2014-03-19 MED ORDER — OXYCODONE-ACETAMINOPHEN 5-325 MG PO TABS: 1.0000 | ORAL_TABLET | ORAL | Status: DC | PRN

## 2014-03-19 MED ORDER — OXYCODONE HCL 5 MG PO TABS: 5.0000 mg | ORAL_TABLET | Freq: Once | ORAL | Status: DC | PRN

## 2014-03-19 MED ORDER — HYDROMORPHONE HCL 1 MG/ML IJ SOLN
0.2500 mg | INTRAMUSCULAR | Status: DC | PRN
  Administered 2014-03-19: 0.25 mg via INTRAVENOUS

## 2014-03-19 MED ORDER — ARTIFICIAL TEARS OP OINT
TOPICAL_OINTMENT | OPHTHALMIC | Status: DC | PRN
  Administered 2014-03-19: 1 via OPHTHALMIC

## 2014-03-19 MED ORDER — PHENYLEPHRINE HCL 10 MG/ML IJ SOLN
INTRAMUSCULAR | Status: DC | PRN
  Administered 2014-03-19 (×2): 80 ug via INTRAVENOUS
  Administered 2014-03-19 (×2): 40 ug via INTRAVENOUS
  Administered 2014-03-19: 80 ug via INTRAVENOUS

## 2014-03-19 MED ORDER — METHOCARBAMOL 500 MG PO TABS
500.0000 mg | ORAL_TABLET | Freq: Four times a day (QID) | ORAL | Status: DC | PRN
Start: 2014-03-19 — End: 2014-03-20

## 2014-03-19 MED ORDER — LACTATED RINGERS IV SOLN
INTRAVENOUS | Status: DC | PRN
  Administered 2014-03-19 (×2): via INTRAVENOUS

## 2014-03-19 MED ORDER — SIMVASTATIN 10 MG PO TABS
10.0000 mg | ORAL_TABLET | Freq: Every morning | ORAL | Status: DC
  Filled 2014-03-19: qty 1

## 2014-03-19 MED ORDER — MENTHOL 3 MG MT LOZG: 1.0000 | LOZENGE | OROMUCOSAL | Status: DC | PRN

## 2014-03-19 MED ORDER — PHENOL 1.4 % MT LIQD: 1.0000 | OROMUCOSAL | Status: DC | PRN

## 2014-03-19 MED ORDER — SODIUM CHLORIDE 0.9 % IV SOLN: 250.0000 mL | INTRAVENOUS | Status: DC

## 2014-03-19 MED ORDER — FENTANYL CITRATE 0.05 MG/ML IJ SOLN
INTRAMUSCULAR | Status: DC | PRN
  Administered 2014-03-19: 100 ug via INTRAVENOUS

## 2014-03-19 MED ORDER — PROMETHAZINE HCL 25 MG/ML IJ SOLN: 6.2500 mg | INTRAMUSCULAR | Status: DC | PRN

## 2014-03-19 MED ORDER — MEPERIDINE HCL 25 MG/ML IJ SOLN
INTRAMUSCULAR | Status: AC
  Filled 2014-03-19: qty 1

## 2014-03-19 MED ORDER — HYDROMORPHONE HCL 1 MG/ML IJ SOLN
INTRAMUSCULAR | Status: AC
  Filled 2014-03-19: qty 1

## 2014-03-19 MED ORDER — OXYCODONE HCL 5 MG/5ML PO SOLN: 5.0000 mg | Freq: Once | ORAL | Status: DC | PRN

## 2014-03-19 MED ORDER — GLYCOPYRROLATE 0.2 MG/ML IJ SOLN
INTRAMUSCULAR | Status: DC | PRN
  Administered 2014-03-19: 0.4 mg via INTRAVENOUS
  Administered 2014-03-19: 0.2 mg via INTRAVENOUS

## 2014-03-19 MED ORDER — KCL IN DEXTROSE-NACL 20-5-0.45 MEQ/L-%-% IV SOLN
INTRAVENOUS | Status: DC
  Filled 2014-03-19 (×3): qty 1000

## 2014-03-19 MED ORDER — ASPIRIN EC 81 MG PO TBEC
81.0000 mg | DELAYED_RELEASE_TABLET | Freq: Every day | ORAL | Status: DC
  Administered 2014-03-19: 81 mg via ORAL
  Filled 2014-03-19 (×2): qty 1

## 2014-03-19 MED ORDER — SODIUM CHLORIDE 0.9 % IJ SOLN: 3.0000 mL | INTRAMUSCULAR | Status: DC | PRN

## 2014-03-19 MED ORDER — FLEET ENEMA 7-19 GM/118ML RE ENEM
1.0000 | ENEMA | Freq: Once | RECTAL | Status: AC | PRN
  Filled 2014-03-19: qty 1

## 2014-03-19 MED ORDER — ONDANSETRON HCL 4 MG/2ML IJ SOLN
INTRAMUSCULAR | Status: DC | PRN
  Administered 2014-03-19: 4 mg via INTRAVENOUS

## 2014-03-19 MED ORDER — NEOSTIGMINE METHYLSULFATE 10 MG/10ML IV SOLN
INTRAVENOUS | Status: DC | PRN
  Administered 2014-03-19: 3 mg via INTRAVENOUS

## 2014-03-19 MED ORDER — ACETAMINOPHEN 325 MG PO TABS: 650.0000 mg | ORAL_TABLET | Freq: Four times a day (QID) | ORAL | Status: DC | PRN

## 2014-03-19 MED ORDER — CEFAZOLIN SODIUM-DEXTROSE 2-3 GM-% IV SOLR
INTRAVENOUS | Status: DC | PRN
  Administered 2014-03-19: 2 g via INTRAVENOUS

## 2014-03-19 MED ORDER — ADULT MULTIVITAMIN W/MINERALS CH
1.0000 | ORAL_TABLET | Freq: Every day | ORAL | Status: DC
  Administered 2014-03-19: 1 via ORAL
  Filled 2014-03-19 (×2): qty 1

## 2014-03-19 MED ORDER — LIDOCAINE HCL (CARDIAC) 20 MG/ML IV SOLN
INTRAVENOUS | Status: AC
  Filled 2014-03-19: qty 5

## 2014-03-19 MED ORDER — FENTANYL CITRATE 0.05 MG/ML IJ SOLN
INTRAMUSCULAR | Status: AC
  Filled 2014-03-19: qty 5

## 2014-03-19 MED ORDER — BISACODYL 10 MG RE SUPP: 10.0000 mg | Freq: Every day | RECTAL | Status: DC | PRN

## 2014-03-19 MED ORDER — HYDROCODONE-ACETAMINOPHEN 5-325 MG PO TABS: 1.0000 | ORAL_TABLET | ORAL | Status: DC | PRN

## 2014-03-19 MED ORDER — BUPIVACAINE HCL (PF) 0.25 % IJ SOLN
INTRAMUSCULAR | Status: DC | PRN
  Administered 2014-03-19: 3 mL

## 2014-03-19 MED ORDER — 0.9 % SODIUM CHLORIDE (POUR BTL) OPTIME
TOPICAL | Status: DC | PRN
  Administered 2014-03-19: 1000 mL

## 2014-03-19 MED ORDER — SODIUM CHLORIDE 0.9 % IJ SOLN: 3.0000 mL | Freq: Two times a day (BID) | INTRAMUSCULAR | Status: DC

## 2014-03-19 MED ORDER — CALCIUM CARB-CHOLECALCIFEROL 600-200 MG-UNIT PO TABS: 1.0000 | ORAL_TABLET | Freq: Every day | ORAL | Status: DC

## 2014-03-19 MED ORDER — AMLODIPINE BESYLATE 10 MG PO TABS
10.0000 mg | ORAL_TABLET | Freq: Every day | ORAL | Status: DC
  Filled 2014-03-19: qty 1

## 2014-03-19 MED ORDER — ROCURONIUM BROMIDE 100 MG/10ML IV SOLN
INTRAVENOUS | Status: DC | PRN
  Administered 2014-03-19: 30 mg via INTRAVENOUS

## 2014-03-19 MED ORDER — PROPOFOL 10 MG/ML IV BOLUS
INTRAVENOUS | Status: DC | PRN
  Administered 2014-03-19: 150 mg via INTRAVENOUS

## 2014-03-19 MED ORDER — PHENYLEPHRINE 40 MCG/ML (10ML) SYRINGE FOR IV PUSH (FOR BLOOD PRESSURE SUPPORT)
PREFILLED_SYRINGE | INTRAVENOUS | Status: AC
  Filled 2014-03-19: qty 10

## 2014-03-19 MED ORDER — ACETAMINOPHEN 650 MG RE SUPP: 650.0000 mg | RECTAL | Status: DC | PRN

## 2014-03-19 MED ORDER — CEFAZOLIN SODIUM 1-5 GM-% IV SOLN
1.0000 g | Freq: Three times a day (TID) | INTRAVENOUS | Status: DC
  Administered 2014-03-19: 1 g via INTRAVENOUS
  Filled 2014-03-19 (×2): qty 50

## 2014-03-19 MED ORDER — ALUM & MAG HYDROXIDE-SIMETH 200-200-20 MG/5ML PO SUSP
30.0000 mL | Freq: Four times a day (QID) | ORAL | Status: DC | PRN
Start: 2014-03-19 — End: 2014-03-20

## 2014-03-19 MED ORDER — METHOCARBAMOL 1000 MG/10ML IJ SOLN
500.0000 mg | Freq: Four times a day (QID) | INTRAVENOUS | Status: DC | PRN
  Filled 2014-03-19: qty 5

## 2014-03-19 MED ORDER — CEFAZOLIN SODIUM-DEXTROSE 2-3 GM-% IV SOLR
INTRAVENOUS | Status: AC
  Filled 2014-03-19: qty 50

## 2014-03-19 MED ORDER — ARTIFICIAL TEARS OP OINT
TOPICAL_OINTMENT | OPHTHALMIC | Status: AC
  Filled 2014-03-19: qty 3.5

## 2014-03-19 MED ORDER — PANTOPRAZOLE SODIUM 40 MG IV SOLR
40.0000 mg | Freq: Every day | INTRAVENOUS | Status: DC
  Filled 2014-03-19: qty 40

## 2014-03-19 MED ORDER — MEPERIDINE HCL 25 MG/ML IJ SOLN
6.2500 mg | INTRAMUSCULAR | Status: AC | PRN
Start: 2014-03-19 — End: 2014-03-19
  Administered 2014-03-19: 6.25 mg via INTRAVENOUS
  Administered 2014-03-19: 18:00:00 via INTRAVENOUS
  Administered 2014-03-19 (×2): 6.25 mg via INTRAVENOUS

## 2014-03-19 MED ORDER — SENNOSIDES-DOCUSATE SODIUM 8.6-50 MG PO TABS
1.0000 | ORAL_TABLET | Freq: Every evening | ORAL | Status: DC | PRN
  Filled 2014-03-19: qty 1

## 2014-03-19 SURGICAL SUPPLY — 45 items
BLADE CLIPPER SURG (BLADE) IMPLANT
BLADE SURG 15 STRL LF DISP TIS (BLADE) ×1 IMPLANT
BLADE SURG 15 STRL SS (BLADE) ×2
CEMENT BONE KYPHON CDS (Cement) ×3 IMPLANT
CONT SPEC 4OZ CLIKSEAL STRL BL (MISCELLANEOUS) ×6 IMPLANT
DECANTER SPIKE VIAL GLASS SM (MISCELLANEOUS) ×3 IMPLANT
DERMABOND ADHESIVE PROPEN (GAUZE/BANDAGES/DRESSINGS) ×2
DERMABOND ADVANCED .7 DNX6 (GAUZE/BANDAGES/DRESSINGS) ×1 IMPLANT
DRAPE C-ARM 42X72 X-RAY (DRAPES) ×3 IMPLANT
DRAPE LAPAROTOMY 100X72X124 (DRAPES) ×3 IMPLANT
DRAPE PROXIMA HALF (DRAPES) ×3 IMPLANT
DRAPE SURG 17X23 STRL (DRAPES) ×3 IMPLANT
DRSG OPSITE 4X5.5 SM (GAUZE/BANDAGES/DRESSINGS) ×3 IMPLANT
DRSG TELFA 3X8 NADH (GAUZE/BANDAGES/DRESSINGS) IMPLANT
DURAPREP 26ML APPLICATOR (WOUND CARE) ×3 IMPLANT
GAUZE SPONGE 4X4 16PLY XRAY LF (GAUZE/BANDAGES/DRESSINGS) ×3 IMPLANT
GLOVE BIO SURGEON STRL SZ8 (GLOVE) ×6 IMPLANT
GLOVE BIOGEL PI IND STRL 8.5 (GLOVE) ×1 IMPLANT
GLOVE BIOGEL PI INDICATOR 8.5 (GLOVE) ×2
GLOVE ECLIPSE 7.5 STRL STRAW (GLOVE) ×3 IMPLANT
GLOVE EXAM NITRILE LRG STRL (GLOVE) IMPLANT
GLOVE EXAM NITRILE MD LF STRL (GLOVE) IMPLANT
GLOVE EXAM NITRILE XL STR (GLOVE) IMPLANT
GLOVE EXAM NITRILE XS STR PU (GLOVE) IMPLANT
GOWN SPEC L3 XXLG W/TWL (GOWN DISPOSABLE) ×3 IMPLANT
GOWN STRL REUS W/ TWL LRG LVL3 (GOWN DISPOSABLE) IMPLANT
GOWN STRL REUS W/ TWL XL LVL3 (GOWN DISPOSABLE) ×2 IMPLANT
GOWN STRL REUS W/TWL 2XL LVL3 (GOWN DISPOSABLE) IMPLANT
GOWN STRL REUS W/TWL LRG LVL3 (GOWN DISPOSABLE)
GOWN STRL REUS W/TWL XL LVL3 (GOWN DISPOSABLE) ×4
KIT BASIN OR (CUSTOM PROCEDURE TRAY) ×3 IMPLANT
KIT ROOM TURNOVER OR (KITS) ×3 IMPLANT
LIQUID BAND (GAUZE/BANDAGES/DRESSINGS) ×3 IMPLANT
NEEDLE HYPO 25X1 1.5 SAFETY (NEEDLE) ×3 IMPLANT
NS IRRIG 1000ML POUR BTL (IV SOLUTION) ×3 IMPLANT
PACK SURGICAL SETUP 50X90 (CUSTOM PROCEDURE TRAY) ×3 IMPLANT
PAD ARMBOARD 7.5X6 YLW CONV (MISCELLANEOUS) ×9 IMPLANT
STAPLER SKIN PROX WIDE 3.9 (STAPLE) ×3 IMPLANT
SUT VIC AB 3-0 SH 8-18 (SUTURE) ×3 IMPLANT
SYR CONTROL 10ML LL (SYRINGE) ×6 IMPLANT
TOWEL OR 17X24 6PK STRL BLUE (TOWEL DISPOSABLE) ×3 IMPLANT
TOWEL OR 17X26 10 PK STRL BLUE (TOWEL DISPOSABLE) ×3 IMPLANT
TRAY KYPHOPAK 15/3 ADDTL FRAC (SET/KITS/TRAYS/PACK) ×3 IMPLANT
TRAY KYPHOPAK 20/3 ONESTEP 1ST (MISCELLANEOUS) IMPLANT
TRAY KYPHOPAK 20/3 ONESTEP CDS (KITS) IMPLANT

## 2014-03-19 NOTE — Anesthesia Procedure Notes (Signed)
Procedure Name: Intubation Date/Time: 03/19/2014 3:47 PM Performed by: Angelica PouSMITH, Alishea Beaudin PIZZICARA Pre-anesthesia Checklist: Patient identified, Timeout performed, Emergency Drugs available, Suction available and Patient being monitored Patient Re-evaluated:Patient Re-evaluated prior to inductionOxygen Delivery Method: Circle system utilized Preoxygenation: Pre-oxygenation with 100% oxygen Intubation Type: IV induction Ventilation: Mask ventilation without difficulty Laryngoscope Size: Mac and 3 Grade View: Grade I Tube type: Oral Tube size: 7.0 mm Number of attempts: 1 Airway Equipment and Method: Stylet Placement Confirmation: ETT inserted through vocal cords under direct vision,  breath sounds checked- equal and bilateral and positive ETCO2 Secured at: 20 cm Tube secured with: Tape Dental Injury: Teeth and Oropharynx as per pre-operative assessment

## 2014-03-19 NOTE — Progress Notes (Signed)
Awake, alert, conversant.  MAEW.  Doing well.  

## 2014-03-19 NOTE — Op Note (Signed)
03/19/2014  4:40 PM  PATIENT:  Rebecca Reed  80 y.o. female  PRE-OPERATIVE DIAGNOSIS:  thoracic 12 compression fx   POST-OPERATIVE DIAGNOSIS:  thoracic 12 compression fx  PROCEDURE:  Procedure(s): Thoracic twelve KYPHOPLASTY (N/A)  SURGEON:  Surgeon(s) and Role:    * Maeola HarmanJoseph Jeny Nield, MD - Primary  PHYSICIAN ASSISTANT:   ASSISTANTS: none   ANESTHESIA:   general  EBL:     BLOOD ADMINISTERED:none  DRAINS: none   LOCAL MEDICATIONS USED:  LIDOCAINE   SPECIMEN:  No Specimen  DISPOSITION OF SPECIMEN:  N/A  COUNTS:  YES  TOURNIQUET:  * No tourniquets in log *  DICTATION: Patient is 80 year old woman with advanced dementia and T 12 fracture which has progressed with increasing collapse and pain.    It was elected to take her to surgery for kyphoplasty procedure of T 12.  PROCEDURE:  Following the smooth and uncomplicated induction of general endotracheal anesthesia, the patient was placed in a prone position on chest rolls.  C-arm fluoroscopy was positioned in both the AP and lateral planes, centered on the T 12 vertebra.  Her back was prepped and draped in the usual sterile fashion with Duraprep.  Using a right uni-pedicular approach, the right T 12 pedicle and vertebral body were entered with the trochar using standard landmarks.  The drill was used, followed by a 15 cc Kyphon balloon, which was used to re-expand the broken vertebra.  Subsequently, 4 cc of bone cement was placed into the void created by the balloon and was seen to fill the fracture cleft and fill the vertebra in both the AP and lateral direction with good interdigitation and without apparent extravasation. The bone void filler was then removed.  Final X-ray demonstrated good filling within the fractured vertebra.  The incision was closed with a single 3-0 vicryl stitch and dressed with Dermabond. The patient was returned to the OR gurney and extubated in the OR and taken to Recovery in stable and satisfactory  condition, having tolerated the procedure well.  Counts were correct at the end of the case.   PLAN OF CARE: Admit for overnight observation  PATIENT DISPOSITION:  PACU - hemodynamically stable.   Delay start of Pharmacological VTE agent (>24hrs) due to surgical blood loss or risk of bleeding: yes

## 2014-03-19 NOTE — Transfer of Care (Signed)
Immediate Anesthesia Transfer of Care Note  Patient: Rebecca Reed  Procedure(s) Performed: Procedure(s): Thoracic twelve KYPHOPLASTY (N/A)  Patient Location: PACU  Anesthesia Type:General  Level of Consciousness: awake, alert  and patient cooperative  Airway & Oxygen Therapy: Patient Spontanous Breathing and Patient connected to nasal cannula oxygen  Post-op Assessment: Report given to PACU RN, Post -op Vital signs reviewed and stable and Patient moving all extremities  Post vital signs: Reviewed and stable  Complications: No apparent anesthesia complications

## 2014-03-19 NOTE — Anesthesia Preprocedure Evaluation (Signed)
Anesthesia Evaluation    Reviewed: Allergy & Precautions, H&P , NPO status , Patient's Chart, lab work & pertinent test results  History of Anesthesia Complications Negative for: history of anesthetic complications  Airway        Dental   Pulmonary former smoker,          Cardiovascular hypertension,     Neuro/Psych negative psych ROS   GI/Hepatic negative GI ROS, Neg liver ROS,   Endo/Other  negative endocrine ROS  Renal/GU negative Renal ROS     Musculoskeletal  (+) Arthritis -,   Abdominal   Peds  Hematology   Anesthesia Other Findings   Reproductive/Obstetrics                             Anesthesia Physical Anesthesia Plan  ASA: III  Anesthesia Plan: General   Post-op Pain Management:    Induction: Intravenous  Airway Management Planned: Oral ETT  Additional Equipment:   Intra-op Plan:   Post-operative Plan: Extubation in OR  Informed Consent:   Plan Discussed with:   Anesthesia Plan Comments:         Anesthesia Quick Evaluation

## 2014-03-19 NOTE — Brief Op Note (Signed)
03/19/2014  4:40 PM  PATIENT:  Rebecca Reed  80 y.o. female  PRE-OPERATIVE DIAGNOSIS:  thoracic 12 compression fx   POST-OPERATIVE DIAGNOSIS:  thoracic 12 compression fx  PROCEDURE:  Procedure(s): Thoracic twelve KYPHOPLASTY (N/A)  SURGEON:  Surgeon(s) and Role:    * Demarqus Jocson, MD - Primary  PHYSICIAN ASSISTANT:   ASSISTANTS: none   ANESTHESIA:   general  EBL:     BLOOD ADMINISTERED:none  DRAINS: none   LOCAL MEDICATIONS USED:  LIDOCAINE   SPECIMEN:  No Specimen  DISPOSITION OF SPECIMEN:  N/A  COUNTS:  YES  TOURNIQUET:  * No tourniquets in log *  DICTATION: Patient is 80 year old woman with advanced dementia and T 12 fracture which has progressed with increasing collapse and pain.    It was elected to take her to surgery for kyphoplasty procedure of T 12.  PROCEDURE:  Following the smooth and uncomplicated induction of general endotracheal anesthesia, the patient was placed in a prone position on chest rolls.  C-arm fluoroscopy was positioned in both the AP and lateral planes, centered on the T 12 vertebra.  Her back was prepped and draped in the usual sterile fashion with Duraprep.  Using a right uni-pedicular approach, the right T 12 pedicle and vertebral body were entered with the trochar using standard landmarks.  The drill was used, followed by a 15 cc Kyphon balloon, which was used to re-expand the broken vertebra.  Subsequently, 4 cc of bone cement was placed into the void created by the balloon and was seen to fill the fracture cleft and fill the vertebra in both the AP and lateral direction with good interdigitation and without apparent extravasation. The bone void filler was then removed.  Final X-ray demonstrated good filling within the fractured vertebra.  The incision was closed with a single 3-0 vicryl stitch and dressed with Dermabond. The patient was returned to the OR gurney and extubated in the OR and taken to Recovery in stable and satisfactory  condition, having tolerated the procedure well.  Counts were correct at the end of the case.   PLAN OF CARE: Admit for overnight observation  PATIENT DISPOSITION:  PACU - hemodynamically stable.   Delay start of Pharmacological VTE agent (>24hrs) due to surgical blood loss or risk of bleeding: yes   

## 2014-03-19 NOTE — Anesthesia Postprocedure Evaluation (Signed)
  Anesthesia Post-op Note  Patient: Rebecca Reed  Procedure(s) Performed: Procedure(s): Thoracic twelve KYPHOPLASTY (N/A)  Patient Location: PACU  Anesthesia Type:General  Level of Consciousness: awake, alert , oriented and patient cooperative  Airway and Oxygen Therapy: Patient Spontanous Breathing and Patient connected to nasal cannula oxygen  Post-op Pain: mild  Post-op Assessment: Post-op Vital signs reviewed, Patient's Cardiovascular Status Stable, Respiratory Function Stable, Patent Airway, No signs of Nausea or vomiting and Pain level controlled  Post-op Vital Signs: Reviewed and stable  Last Vitals:  Filed Vitals:   03/19/14 1828  BP:   Pulse:   Temp: 36.7 C  Resp:     Complications: No apparent anesthesia complications

## 2014-03-20 DIAGNOSIS — M4854XA Collapsed vertebra, not elsewhere classified, thoracic region, initial encounter for fracture: Secondary | ICD-10-CM | POA: Diagnosis not present

## 2014-03-20 DIAGNOSIS — S22000A Wedge compression fracture of unspecified thoracic vertebra, initial encounter for closed fracture: Secondary | ICD-10-CM | POA: Diagnosis present

## 2014-03-20 NOTE — Progress Notes (Signed)
Patient ID: Rebecca Reed, female   DOB: 09/08/1931, 80 y.o.   MRN: [id number] Doing well ready for discharge

## 2014-03-20 NOTE — Discharge Summary (Signed)
  Physician Discharge Summary  Patient ID: Rebecca Reed MRN: [id number] DOB/AGE: 78/06/1931 78 y.o.  Admit date: 03/19/2014 Discharge date: 03/20/2014  Admission Diagnoses: Thoracic compression deformity  Discharge Diagnoses: Same Active Problems:   Thoracic compression fracture   Compression fracture of body of thoracic vertebra   Discharged Condition: good  Hospital Course: Patient was admitted to the hospital underwent a kyphoplasty was observed overnight and was stable for discharge the next day  Consults: Significant Diagnostic Studies: Treatments: Kyphoplasty Discharge Exam: Blood pressure 161/63, pulse 58, temperature 98.2 F (36.8 C), temperature source Oral, resp. rate 18, height 5\' 3"  (1.6 m), weight 71.215 kg (157 lb), SpO2 97.00%. Awake alert and oriented ambulating no pain  Disposition: Home     Medication List         acetaminophen 325 MG tablet  Commonly known as:  TYLENOL  Take 650 mg by mouth every 6 (six) hours as needed for mild pain.     amLODipine 10 MG tablet  Commonly known as:  NORVASC  Take 10 mg by mouth daily.     aspirin EC 81 MG tablet  Take 81 mg by mouth daily.     CALCIUM 600 + D 600-200 MG-UNIT Tabs  Generic drug:  Calcium Carb-Cholecalciferol  Take 1 tablet by mouth daily.     donepezil 5 MG tablet  Commonly known as:  ARICEPT  Take 5 mg by mouth at bedtime.     lisinopril 20 MG tablet  Commonly known as:  PRINIVIL,ZESTRIL  Take 20 mg by mouth daily.     multivitamin with minerals Tabs tablet  Take 1 tablet by mouth daily.     simvastatin 10 MG tablet  Commonly known as:  ZOCOR  Take 10 mg by mouth every morning.           Follow-up Information   Follow up with Dorian HeckleSTERN,JOSEPH D, MD.   Specialty:  Neurosurgery   Contact information:   [address] [place] [address] [phone]       Signed: [name],Rebecca Reed 03/20/2014, 8:47 AM

## 2014-03-20 NOTE — Progress Notes (Signed)
Pt doing well. Pt and husband given D/C instructions with verbal understanding. Pt's incision was clean and dry with no sign of infection. Pt's IV was removed prior to D/C. Pt d/c'd home via wheelchair @ [phone] per MD order. Pt is stable @ D/C and has no other needs at this time. Rema FendtAshley Deyonte Cadden, RN

## 2014-03-20 NOTE — Discharge Instructions (Signed)
Wound Care Leave incision open to air. You may shower. Do not scrub directly on incision.  Do not put any creams, lotions, or ointments on incision. Activity Walk each and every day, increasing distance each day. No lifting greater than 5 lbs.  Avoid bending, arching, and twisting. No driving for 2 weeks; may ride as a passenger locally. If provided with back brace, wear when out of bed.  It is not necessary to wear in bed. Diet Resume your normal diet.  Return to Work Will be discussed at you follow up appointment. Call Your Doctor If Any of These Occur Redness, drainage, or swelling at the wound.  Temperature greater than 101 degrees. Severe pain not relieved by pain medication. Incision starts to come apart. Follow Up Appt Call today for appointment in 3-4 weeks ([phone]) or for problems.  If you have any hardware placed in your spine, you will need an x-ray before your appointment.    Balloon Kyphoplasty Balloon kyphoplasty is a procedure in which orthopedic balloons are used to gently raise a collapsed vertebral body in an attempt to alleviate pain. This condition is also called a vertebral compression fracture, or VCF. Most often the cause of this collapse is due to osteoporosis of the vertebral body, which makes the bone susceptible to minor trauma. Osteoporosis is a condition that comes on with aging. Osteoporosis is due to a loss of mineral from the bone. This causes a softening of the bones. The diagnosis of these fractures is usually made with X-rays or magnetic resonance imaging (MRI). Some advantages of this procedure are:  Pain relief.  Restoration of vertebral body height.  Correction of spinal deformity.  Relatively low complication rate. The procedure is usually done by orthopedic surgeons, neurosurgeons, interventional radiologists, and interventional neuroradiologists who specialize in treating the spine with balloon kyphoplasty.  This procedure is not useful  for:  Patients with young, healthy bones or those who have sustained a vertebral body fracture or collapse in a major accident.  Patients with spinal curvature such as scoliosis or kyphosis that is due to causes other than osteoporosis.  Patients who suffer from spinal stenosis or herniated discs with nerve or spinal cord compression and loss of neurological function not associated with a vertebral compression fracture.  Patients with known metastatic disease of the spine. THE BENEFITS OF BALLOON KYPHOPLASTY INCLUDE:  Reduction in back pain. If there is pain due to the procedure, it will typically lessen within two weeks.  Improved quality of life.  Improved mobility (you can get around better).  Improved ability to perform activities of daily living. PROCEDURE  The kyphoplasty procedure involves the use of a balloon to restore the vertebral body height and shape. This is followed by placement of bone cement to strengthen it. The procedure may be done under intravenous sedation, local anesthetic, or general anesthetic. The patient lies face-down on the operating room table. X-ray machines are used to show the collapsed bones.  The surgeon makes two small incisions. A tube is then inserted into the center of the vertebral body. Through this tube, balloons are placed in the vertebral body. Then the balloons are inflated. This creates a cavity. This pushes the bone back toward its normal height and shape.  Once the cavity is created, the surgeon removes the inflatable balloon. The cement is mixed and used to fill the cavity in a slow and controlled fashion. The cement hardens. Then the surgeon takes out the tubes. The incisions are closed with a  single stitch. Patients usually go home the same day. Patients can go back to all normal activities of daily living as soon as possible. There are no restrictions.  RISKS AND COMPLICATIONS As with any surgery, there are potential risks. Although the  procedure is designed to minimize risks, complications may occur. Be sure to discuss the risks with your caregiver. Balloon kyphoplasty is not right for all patients. Complications may require more treatments. Complications that can occur include:  The usual risks of local or general anesthetics apply. These risks depend on the patient's overall health.  Heart attack (myocardial infarction).  Stroke (cerebrovascular accident).  A blockage in the lung (pulmonary embolism - there is a very small chance of the cement traveling to lungs).  There is a small risk of the bone cement leaking from within the boundaries of the vertebral body. In most cases, this rare event does not cause any problems. However, if the cement does leak it may cause:  Pain.  Altered sensation.  Very rarely, paralysis.  Should the cement leak further, more significant surgery may be needed to stop the irritation of the nerves or spinal cord.  In very rare circumstances, the cement may irritate or damage the spinal cord or nerves.  Heart stops beating (cardiac arrest).  Excessive bleeding (hemorrhage).  Infection (there is a small chance of the cement block becoming infected at the time of surgery or even years later). Document Released: 04/12/2004 Document Revised: 09/21/2013 Document Reviewed: 11/16/2008 [facility] Patient Information 2015 [employer] [place]. This information is not intended to replace advice given to you by your health care provider. Make sure you discuss any questions you have with your health care provider.

## 2014-03-20 NOTE — Evaluation (Signed)
Physical Therapy Evaluation Patient Details Name: Rebecca Reed W Legacy MRN: [id number] DOB: 08/14/1931 Today's Date: 03/20/2014   History of Present Illness  s/p T 12 kyphoplasty following a fall 2 months ago and conservative management.  Clinical Impression  All education complete, no further acute PT needs. Safe for dc home.    Follow Up Recommendations No PT follow up    Equipment Recommendations  None recommended by PT    Recommendations for Other Services       Precautions / Restrictions Precautions Precautions: Back      Mobility  Bed Mobility Overal bed mobility: Modified Independent                Transfers Overall transfer level: Modified independent Equipment used: Straight cane                Ambulation/Gait Ambulation/Gait assistance: Supervision Ambulation Distance (Feet): 180 Feet Assistive device: Straight cane Gait Pattern/deviations: Step-through pattern;Decreased stride length;Decreased weight shift to left;Drifts right/left Gait velocity: decreased Gait velocity interpretation: Below normal speed for age/gender General Gait Details: some modest instability with 2 noted balance checks able to self correct  Stairs            Wheelchair Mobility    Modified Rankin (Stroke Patients Only)       Balance Overall balance assessment: History of Falls                                           Pertinent Vitals/Pain Pain Assessment: No/denies pain    Home Living Family/patient expects to be discharged to:: Private residence Living Arrangements: Spouse/significant other Available Help at Discharge: Family;Available 24 hours/day Type of Home: House Home Access: Level entry     Home Layout: One level Home Equipment: Grab bars - toilet;Grab bars - tub/shower;Shower seat;Toilet riser;Cane - single point      Prior Function Level of Independence: Needs assistance   Gait / Transfers Assistance Needed: ambulates  with cane  ADL's / Homemaking Assistance Needed: husband does cooking, cleaning and laundry        Hand Dominance   Dominant Hand: Right    Extremity/Trunk Assessment   Upper Extremity Assessment: Overall WFL for tasks assessed           Lower Extremity Assessment: Overall WFL for tasks assessed      Cervical / Trunk Assessment: Normal  Communication   Communication: No difficulties  Cognition Arousal/Alertness: Awake/alert Behavior During Therapy: WFL for tasks assessed/performed Overall Cognitive Status: History of cognitive impairments - at baseline                      General Comments      Exercises        Assessment/Plan    PT Assessment Patent does not need any further PT services  PT Diagnosis Difficulty walking   PT Problem List    PT Treatment Interventions     PT Goals (Current goals can be found in the Care Plan section) Acute Rehab PT Goals Patient Stated Goal: Home ASAP. PT Goal Formulation: With patient/family Time For Goal Achievement: 03/27/14    Frequency     Barriers to discharge        Co-evaluation PT/OT/SLP Co-Evaluation/Treatment:  (Dove tailed session with OT)             End of Session   Activity Tolerance:  Patient tolerated treatment well Patient left: in bed;with family/visitor present Nurse Communication: Mobility status         Time: [id number] PT Time Calculation (min): 24 min   Charges:   PT Evaluation $Initial PT Evaluation Tier I: 1 Procedure PT Treatments $Gait Training: 8-22 mins   PT G CodesFabio Asa:          Driana Dazey J 03/20/2014, 10:05 AM Charlotte Crumbevon Daveion Robar, PT DPT  [phone]

## 2014-03-20 NOTE — Evaluation (Signed)
Occupational Therapy Evaluation Patient Details Name: Rebecca Reed MRN: [id number] DOB: 12/26/1931 Today's Date: 03/20/2014    History of Present Illness s/p T 12 kyphoplasty following a fall 2 months ago and conservative management.   Clinical Impression   Pt and husband are educated in back precautions and safety. Pt has all necessary equipment at home and supervision of her husband. She is modified independent in ADL and ADL transfers. Husband performs housekeeping, laundry and meal prep.  Instructed pt to return to MD prior to resuming exercise program and driving. No further OT needs.  Follow Up Recommendations  No OT follow up    Equipment Recommendations  None recommended by OT    Recommendations for Other Services       Precautions / Restrictions Precautions Precautions: Back      Mobility Bed Mobility Overal bed mobility: Modified Independent                Transfers Overall transfer level: Modified independent Equipment used: Straight cane                  Balance                                            ADL Overall ADL's : Modified independent;At baseline                                       General ADL Comments: Pt is well aware of back precautions and safety.     Vision                     Perception     Praxis      Pertinent Vitals/Pain Pain Assessment: No/denies pain     Hand Dominance Right   Extremity/Trunk Assessment Upper Extremity Assessment Upper Extremity Assessment: Overall WFL for tasks assessed   Lower Extremity Assessment Lower Extremity Assessment: Overall WFL for tasks assessed   Cervical / Trunk Assessment Cervical / Trunk Assessment: Normal   Communication Communication Communication: No difficulties   Cognition Arousal/Alertness: Awake/alert Behavior During Therapy: WFL for tasks assessed/performed Overall Cognitive Status: History of cognitive  impairments - at baseline                     General Comments       Exercises       Shoulder Instructions      Home Living Family/patient expects to be discharged to:: Private residence Living Arrangements: Spouse/significant other Available Help at Discharge: Family;Available 24 hours/day Type of Home: House Home Access: Level entry     Home Layout: One level     Bathroom Shower/Tub: Producer, television/film/videoWalk-in shower   Bathroom Toilet: Standard     Home Equipment: Grab bars - toilet;Grab bars - tub/shower;Shower seat;Toilet riser;Cane - single point, bedrail          Prior Functioning/Environment Level of Independence: Needs assistance  Gait / Transfers Assistance Needed: ambulates with cane ADL's / Homemaking Assistance Needed: husband does cooking, cleaning and laundry        OT Diagnosis:     OT Problem List:     OT Treatment/Interventions:      OT Goals(Current goals can be found in the care plan section) Acute Rehab OT Goals Patient  Stated Goal: Home ASAP.  OT Frequency:     Barriers to D/C:            Co-evaluation              End of Session    Activity Tolerance: Patient tolerated treatment well Patient left: in bed;with call bell/phone within reach;with family/visitor present   Time: [id number] OT Time Calculation (min): 24 min Charges:  OT General Charges $OT Visit: 1 Procedure OT Evaluation $Initial OT Evaluation Tier I: 1 Procedure G-Codes: OT G-codes **NOT FOR INPATIENT CLASS** Functional Assessment Tool Used: clinical judgement Functional Limitation: Self care Self Care Current Status (A2130(G8987): At least 1 percent but less than 20 percent impaired, limited or restricted Self Care Goal Status (Q6578(G8988): At least 1 percent but less than 20 percent impaired, limited or restricted Self Care Discharge Status [phone](G8989): At least 1 percent but less than 20 percent impaired, limited or restricted  [name], Rebecca Reed 03/20/2014, 8:56  AM [phone]

## 2014-03-22 ENCOUNTER — Encounter (HOSPITAL_COMMUNITY): Payer: Self-pay | Admitting: Neurosurgery

## 2014-03-25 ENCOUNTER — Inpatient Hospital Stay (HOSPITAL_COMMUNITY)
Admission: AD | Admit: 2014-03-25 | Discharge: 2014-03-29 | DRG: 544 | Disposition: A | Discharge status: Nursing Home (Includes ICF) | Payer: Medicare Other | Source: Other Acute Inpatient Hospital | Attending: Neurosurgery | Admitting: Neurosurgery

## 2014-03-25 ENCOUNTER — Emergency Department: Payer: Self-pay | Admitting: Emergency Medicine

## 2014-03-25 DIAGNOSIS — I10 Essential (primary) hypertension: Secondary | ICD-10-CM | POA: Diagnosis present

## 2014-03-25 DIAGNOSIS — M4854XA Collapsed vertebra, not elsewhere classified, thoracic region, initial encounter for fracture: Secondary | ICD-10-CM | POA: Diagnosis present

## 2014-03-25 DIAGNOSIS — E785 Hyperlipidemia, unspecified: Secondary | ICD-10-CM | POA: Diagnosis present

## 2014-03-25 DIAGNOSIS — Z87891 Personal history of nicotine dependence: Secondary | ICD-10-CM | POA: Diagnosis not present

## 2014-03-25 DIAGNOSIS — M1990 Unspecified osteoarthritis, unspecified site: Secondary | ICD-10-CM | POA: Diagnosis present

## 2014-03-25 DIAGNOSIS — Z7982 Long term (current) use of aspirin: Secondary | ICD-10-CM | POA: Diagnosis not present

## 2014-03-25 DIAGNOSIS — Z79899 Other long term (current) drug therapy: Secondary | ICD-10-CM

## 2014-03-25 DIAGNOSIS — Z96653 Presence of artificial knee joint, bilateral: Secondary | ICD-10-CM | POA: Diagnosis present

## 2014-03-25 DIAGNOSIS — IMO0002 Reserved for concepts with insufficient information to code with codable children: Secondary | ICD-10-CM

## 2014-03-25 DIAGNOSIS — M546 Pain in thoracic spine: Secondary | ICD-10-CM | POA: Diagnosis present

## 2014-03-25 LAB — COMPREHENSIVE METABOLIC PANEL
ALBUMIN: 3.7 g/dL (ref 3.4–5.0)
ALK PHOS: 71 U/L
ALT: 42 U/L
AST: 41 U/L — AB (ref 15–37)
Anion Gap: 11 (ref 7–16)
BILIRUBIN TOTAL: 1.1 mg/dL — AB (ref 0.2–1.0)
BUN: 14 mg/dL (ref 7–18)
Calcium, Total: 9 mg/dL (ref 8.5–10.1)
Chloride: 109 mmol/L — ABNORMAL HIGH (ref 98–107)
Co2: 24 mmol/L (ref 21–32)
Creatinine: 0.9 mg/dL (ref 0.60–1.30)
EGFR (Non-African Amer.): >60
Glucose: 102 mg/dL — ABNORMAL HIGH (ref 65–99)
Osmolality: 288 (ref 275–301)
Potassium: 3.1 mmol/L — ABNORMAL LOW (ref 3.5–5.1)
SODIUM: 144 mmol/L (ref 136–145)
TOTAL PROTEIN: 7.4 g/dL (ref 6.4–8.2)

## 2014-03-25 LAB — URINALYSIS, COMPLETE
BILIRUBIN, UR: NEGATIVE
Glucose,UR: NEGATIVE mg/dL (ref 0–75)
LEUKOCYTE ESTERASE: NEGATIVE
Nitrite: NEGATIVE
PH: 8 (ref 4.5–8.0)
Protein: NEGATIVE
Specific Gravity: 1.006 (ref 1.003–1.030)
WBC UR: <1 /HPF (ref 0–5)

## 2014-03-25 LAB — CBC WITH DIFFERENTIAL/PLATELET
BASOS PCT: 0.4 %
Basophil #: 0 10*3/uL (ref 0.0–0.1)
EOS PCT: 1.8 %
Eosinophil #: 0.1 10*3/uL (ref 0.0–0.7)
HCT: 40.1 % (ref 35.0–47.0)
HGB: 13.4 g/dL (ref 12.0–16.0)
Lymphocyte #: 1.5 10*3/uL (ref 1.0–3.6)
Lymphocyte %: 24.5 %
MCH: 29.4 pg (ref 26.0–34.0)
MCHC: 33.5 g/dL (ref 32.0–36.0)
MCV: 88 fL (ref 80–100)
Monocyte #: 0.6 x10 3/mm (ref 0.2–0.9)
Monocyte %: 9.6 %
Neutrophil #: 4 10*3/uL (ref 1.4–6.5)
Neutrophil %: 63.7 %
Platelet: 146 10*3/uL — ABNORMAL LOW (ref 150–440)
RBC: 4.57 10*6/uL (ref 3.80–5.20)
RDW: 13.9 % (ref 11.5–14.5)
WBC: 6.2 10*3/uL (ref 3.6–11.0)

## 2014-03-25 LAB — LIPASE, BLOOD: Lipase: 123 U/L (ref 73–393)

## 2014-03-25 MED ORDER — LISINOPRIL 20 MG PO TABS
20.0000 mg | ORAL_TABLET | Freq: Every day | ORAL | Status: DC
  Administered 2014-03-26 – 2014-03-28 (×2): 20 mg via ORAL
  Filled 2014-03-25 (×3): qty 1

## 2014-03-25 MED ORDER — DONEPEZIL HCL 5 MG PO TABS
5.0000 mg | ORAL_TABLET | Freq: Every day | ORAL | Status: DC
  Administered 2014-03-25 – 2014-03-28 (×4): 5 mg via ORAL
  Filled 2014-03-25 (×4): qty 1

## 2014-03-25 MED ORDER — OXYCODONE HCL 5 MG PO TABS
5.0000 mg | ORAL_TABLET | ORAL | Status: DC | PRN
  Administered 2014-03-29: 5 mg via ORAL
  Filled 2014-03-25: qty 1

## 2014-03-25 MED ORDER — AMLODIPINE BESYLATE 10 MG PO TABS
10.0000 mg | ORAL_TABLET | Freq: Every day | ORAL | Status: DC
  Administered 2014-03-26 – 2014-03-28 (×2): 10 mg via ORAL
  Filled 2014-03-25: qty 2
  Filled 2014-03-25: qty 1
  Filled 2014-03-25: qty 2

## 2014-03-25 MED ORDER — ACETAMINOPHEN 325 MG PO TABS
650.0000 mg | ORAL_TABLET | Freq: Four times a day (QID) | ORAL | Status: DC | PRN
  Administered 2014-03-25 – 2014-03-28 (×2): 650 mg via ORAL
  Filled 2014-03-25 (×2): qty 2

## 2014-03-25 NOTE — H&P (Signed)
  Rebecca Reed is an 80 y.o. female.   Chief Complaint: back pain HPI: 80 yo female who had a vertebroplasty 6 days ago by Dr Venetia MaxonStern for a compression fracture of T12. She did well and went home pod 1. During the week, she has been pulling herself up on her bed. When she went to do that this morning she noted bad right lower back pain. [facility] CT of the abdomen and pelvis that showed that there was some retropulsion of bone now that had not been there before. [facility]  Past Medical History  Diagnosis Date  . HTN (hypertension)   . Thrombocytopenia   . Memory loss   . Hyperlipidemia   . Low back pain     Chronic  . T11 vertebral fracture     01/14/2014  . Arthritis     Past Surgical History  Procedure Laterality Date  . Total knee arthroplasty      bil    . Cholecystectomy    . Abdominal hysterectomy    . Kyphoplasty N/A 03/19/2014    Procedure: Thoracic twelve KYPHOPLASTY;  Surgeon: Maeola HarmanJoseph Stern, MD;  Location: [facility];  Service: Neurosurgery;  Laterality: N/A;    No family history on file. Social History:  reports that she has quit smoking. She does not have any smokeless tobacco history on file. She reports that she does not drink alcohol or use illicit drugs.  Allergies:  Allergies  Allergen Reactions  . Other Anaphylaxis    Uncoded Allergy. Allergen: THALLIUM    Medications Prior to Admission  Medication Sig Dispense Refill  . acetaminophen (TYLENOL) 325 MG tablet Take 650 mg by mouth every 6 (six) hours as needed for mild pain.    Marland Kitchen. amLODipine (NORVASC) 10 MG tablet Take 10 mg by mouth daily.    Marland Kitchen. aspirin EC 81 MG tablet Take 81 mg by mouth daily.    . Calcium Carb-Cholecalciferol (CALCIUM 600 + D) 600-200 MG-UNIT TABS Take 1 tablet by mouth daily.    Marland Kitchen. donepezil (ARICEPT) 5 MG tablet Take 5 mg by mouth at bedtime.    Marland Kitchen. lisinopril (PRINIVIL,ZESTRIL) 20 MG tablet Take 20 mg by  mouth daily.    . Multiple Vitamin (MULTIVITAMIN WITH MINERALS) TABS tablet Take 1 tablet by mouth daily.    . simvastatin (ZOCOR) 10 MG tablet Take 10 mg by mouth every morning.      No results found for this or any previous visit (from the past 48 hour(s)). No results found.  Review of systems not obtained due to patient factors.  Blood pressure 162/61, pulse 72, temperature 98.1 F (36.7 C), temperature source Oral, resp. rate 18, SpO2 100 %.  Awake, and alert, but not greatly oriented or concise secondary to some baseline dementia. She has excellent lower extremity strength, and moves her legs well to command.Her sensation is intact as well. Assessment/Plan CT reviewed and does show some retropulsion of the bone with some mild secondary stenosis. She is however stable neurologically, and in no need of emergent treatment. I thionk we should admit her, get her pain under control, try some physical therapy, and see how she does over the weekend. Dr Venetia MaxonStern can then decide on further treatment once he returns.  Reinaldo MeekerKRITZER,Kooper Chriswell O, MD 03/25/2014, 5:40 PM

## 2014-03-25 NOTE — Progress Notes (Signed)
Patient arrived to unit via [facility].  Patient has been settled in the room and awaiting for doctors orders.

## 2014-03-26 ENCOUNTER — Encounter (HOSPITAL_COMMUNITY): Payer: Self-pay | Admitting: General Practice

## 2014-03-26 NOTE — Evaluation (Signed)
Physical Therapy Evaluation Patient Details Name: Rebecca Reed W Ollinger MRN: [id number] DOB: 08/18/1931 Today's Date: 03/26/2014   History of Present Illness  Adm 03/26/11 after attempting to get OOB and had sharp back pain. Per MD note, pt [facility] which showed retropulsion of bone from T12 into canal. Adm for pain control. PMHx-s/p T 12 kyphoplasty 10/30; fall 12/2013, memory loss  Clinical Impression  Patient is s/p recent T12 kyphoplasty with recent T12 fracture resulting in the deficits listed below (see PT Problem List). Pt has memory loss and cannot recall her precautions and has been twisting when reaching for her bed rail at home. Husband recalls all precautions, however admits he has not always been with pt as she moves about the house. Patient may benefit from skilled PT to increase their independence and safety with mobility (while adhering to their precautions) to allow discharge home with husband.       Follow Up Recommendations Home health PT;Supervision/Assistance - 24 hour    Equipment Recommendations  Hospital bed (husband undecided if this will be beneficial)    Recommendations for Other Services       Precautions / Restrictions Precautions Precautions: Back Precaution Comments: husband able to state 3/3 back precautions; pt unable (h/o dementia) Required Braces or Orthoses:  (none per husband; was d/c'd after kyphoplasty)      Mobility  Bed Mobility Overal bed mobility: Needs Assistance Bed Mobility: Rolling;Sidelying to Sit Rolling: Min guard Sidelying to sit: Min assist       General bed mobility comments: HOB 0 with rail; pt immediately began reaching Lt UE across for rail with twisting of spine and grimacing. Returned to supine with thorough explanation and visual demonstration of how to avoid twisting. Required step by step instructions to maintain precautions. Pt able to push up onto Rt elbow, but requires support/assist when transitioning up onto Rt  hand to sit fully upright  Transfers Overall transfer level: Needs assistance Equipment used: Straight cane;Rolling walker (2 wheeled) Transfers: Sit to/from Stand Sit to Stand: Supervision         General transfer comment: vc for safe use of RW  Ambulation/Gait Ambulation/Gait assistance: Min guard Ambulation Distance (Feet): 30 Feet (40) Assistive device: Straight cane;Rolling walker (2 wheeled) Gait Pattern/deviations: Step-through pattern;Decreased stride length;Trunk flexed Gait velocity: decreased   General Gait Details: pt wanted to use her cane, however pt with antalgic pattern with incr lateral flexion; with use of RW, pt remained midline and reported her back felt a bit better with RW  Stairs            Wheelchair Mobility    Modified Rankin (Stroke Patients Only)       Balance Overall balance assessment: History of Falls                                           Pertinent Vitals/Pain Pain Assessment: Faces Faces Pain Scale: Hurts little more Pain Location: lower Rt back Pain Intervention(s): Limited activity within patient's tolerance;Monitored during session;Repositioned    Home Living Family/patient expects to be discharged to:: Private residence Living Arrangements: Spouse/significant other Available Help at Discharge: Family;Available 24 hours/day Type of Home: House Home Access: Level entry     Home Layout: One level Home Equipment: Grab bars - toilet;Grab bars - tub/shower;Shower seat;Toilet riser;Cane - single point;Walker - 2 wheels (added bedrail to her bed)  Prior Function Level of Independence: Needs assistance   Gait / Transfers Assistance Needed: ambulates with cane outside; furniture walks inside  ADL's / Homemaking Assistance Needed: husband does cooking, cleaning and laundry        Hand Dominance   Dominant Hand: Right    Extremity/Trunk Assessment   Upper Extremity Assessment: Overall WFL for  tasks assessed           Lower Extremity Assessment: Overall WFL for tasks assessed      Cervical / Trunk Assessment: Normal  Communication   Communication: No difficulties  Cognition Arousal/Alertness: Awake/alert Behavior During Therapy: WFL for tasks assessed/performed Overall Cognitive Status: History of cognitive impairments - at baseline       Memory: Decreased recall of precautions              General Comments General comments (skin integrity, edema, etc.): Husband present throughout    Exercises        Assessment/Plan    PT Assessment Patient needs continued PT services  PT Diagnosis Difficulty walking;Acute pain   PT Problem List Decreased activity tolerance;Decreased mobility;Decreased cognition;Decreased knowledge of use of DME;Decreased knowledge of precautions;Pain  PT Treatment Interventions DME instruction;Gait training;Functional mobility training;Therapeutic activities;Cognitive remediation;Patient/family education   PT Goals (Current goals can be found in the Care Plan section) Acute Rehab PT Goals Patient Stated Goal: decr back pain PT Goal Formulation: With patient/family Time For Goal Achievement: 04/03/14 Potential to Achieve Goals: Good    Frequency Min 5X/week   Barriers to discharge        Co-evaluation PT/OT/SLP Co-Evaluation/Treatment:  (Dove tailed session with OT)             End of Session   Activity Tolerance: Patient tolerated treatment well Patient left: in chair;with call bell/phone within reach;with chair alarm set;with family/visitor present Nurse Communication: Mobility status         Time: [id number] PT Time Calculation (min): 28 min   Charges:   PT Evaluation $Initial PT Evaluation Tier I: 1 Procedure PT Treatments $Gait Training: 8-22 mins   PT G Codes:          Andretta Ergle 03/26/2014, 12:32 PM Pager [phone]

## 2014-03-26 NOTE — Progress Notes (Signed)
Patient ID: Rebecca Reed, female   DOB: 10/13/1931, 80 y.o.   MRN: [id number] Afeb, vss No new neuro issues Will increase activity over the weekend, and let Dr Venetia MaxonStern decide on disposition on Monday when he returns.

## 2014-03-27 NOTE — Progress Notes (Signed)
Physical Therapy Treatment Patient Details Name: Rebecca Reed MRN: [id number] DOB: 06/25/1931 Today's Date: 03/27/2014    History of Present Illness Adm 03/26/11 after attempting to get OOB and had sharp back pain. Per MD note, [facility] which showed retropulsion of bone from T12 into canal. Adm for pain control. PMHx-s/p T 12 kyphoplasty 10/30; fall 12/2013, memory loss    PT Comments    Patient progressing very well with ambulation however bed mobility tends to be difficult. Patient reeducated on log roll technique and to avoid twisting. Continue with current POC. Plan is to DC possibly monday  Follow Up Recommendations  Home health PT;Supervision/Assistance - 24 hour     Equipment Recommendations  Hospital bed    Recommendations for Other Services       Precautions / Restrictions Precautions Precautions: Back Precaution Comments: Patient able to recall 2/3 precautions on her own. Husband able to recall all    Mobility  Bed Mobility Overal bed mobility: Needs Assistance Bed Mobility: Rolling;Sidelying to Sit;Sit to Sidelying Rolling: Min guard Sidelying to sit: Min assist     Sit to sidelying: Min assist General bed mobility comments: Patient educated on log roll technique. Tends to roll knees over prior to shoulders causing increased pain. Support at hips to transition into upright sitting  Transfers Overall transfer level: Needs assistance Equipment used: Rolling walker (2 wheeled) Transfers: Sit to/from Stand Sit to Stand: Supervision         General transfer comment: Supervision for safety.   Ambulation/Gait Ambulation/Gait assistance: Supervision Ambulation Distance (Feet): 600 Feet Assistive device: Rolling walker (2 wheeled) Gait Pattern/deviations: Step-through pattern;Decreased stride length   Gait velocity interpretation: at or above normal speed for age/gender General Gait Details: Utilized RW for positioning and safety with  increased gait distance   Stairs            Wheelchair Mobility    Modified Rankin (Stroke Patients Only)       Balance                                    Cognition Arousal/Alertness: Awake/alert Behavior During Therapy: WFL for tasks assessed/performed Overall Cognitive Status: History of cognitive impairments - at baseline       Memory: Decreased recall of precautions              Exercises      General Comments        Pertinent Vitals/Pain Faces Pain Scale: Hurts little more Pain Location: Low back Pain Intervention(s): Monitored during session;Patient requesting pain meds-RN notified    Home Living                      Prior Function            PT Goals (current goals can now be found in the care plan section) Progress towards PT goals: Progressing toward goals    Frequency  Min 5X/week    PT Plan Current plan remains appropriate    Co-evaluation             End of Session Equipment Utilized During Treatment: Gait belt Activity Tolerance: Patient tolerated treatment well Patient left: in chair;with call bell/phone within reach     Time: 1339-1355 PT Time Calculation (min): 16 min  Charges:  $Gait Training: 8-22 mins  G Codes:      Fredrich BirksRobinette, Khadeem Rockett Elizabeth 03/27/2014, 2:04 PM  03/27/2014 Fredrich Birksobinette, Syreeta Figler Elizabeth PTA [phone] pager [phone] office

## 2014-03-27 NOTE — Progress Notes (Signed)
Subjective: Patient reports she is doing fairly well. She has appropriate back soreness. She has no leg pain or numbness tingling or weakness.  Objective: Vital signs in last 24 hours: Temp:  [97.9 F (36.6 C)-98.5 F (36.9 C)] 98.2 F (36.8 C) (11/07 0552) Pulse Rate:  [58-73] 58 (11/07 0552) Resp:  [16-20] 20 (11/07 0552) BP: (130-152)/(49-82) 130/53 mmHg (11/07 0552) SpO2:  [96 %-99 %] 98 % (11/07 0552) Weight:  [153 lb 14.4 oz (69.809 kg)] 153 lb 14.4 oz (69.809 kg) (11/06 1700)  Intake/Output from previous day: 11/06 0730 - 11/07 0729 In: 480 [P.O.:480] Out: -  Intake/Output this shift: Total I/O In: 240 [P.O.:240] Out: -   Neurologic: Grossly normal, she moves her legs well and appears quite comfortable sitting in a chair eating her breakfast  Lab Results: Lab Results  Component Value Date   WBC 4.8 03/10/2014   HGB 12.7 03/10/2014   HCT 38.1 03/10/2014   MCV 86.6 03/10/2014   PLT 147* 03/10/2014   Lab Results  Component Value Date   INR 1.14 01/15/2014   BMET Lab Results  Component Value Date   NA 144 03/10/2014   K 4.0 03/10/2014   CL 103 03/10/2014   CO2 29 03/10/2014   GLUCOSE 95 03/10/2014   BUN 20 03/10/2014   CREATININE 0.98 03/10/2014   CALCIUM 9.8 03/10/2014    Studies/Results: No results found.  Assessment/Plan: Stable. Awaiting disposition from Dr. Venetia MaxonStern.   LOS: 2 days    Albena Comes S 03/27/2014, 9:06 AM

## 2014-03-28 NOTE — Progress Notes (Signed)
Physical Therapy Treatment Patient Details Name: Rebecca Reed MRN: [id number] DOB: 03/15/1932 Today's Date: 03/28/2014    History of Present Illness Adm 03/26/11 after attempting to get OOB and had sharp back pain. Per MD note, [facility] which showed retropulsion of bone from T12 into canal. Adm for pain control. PMHx-s/p T 12 kyphoplasty 10/30; fall 12/2013, memory loss    PT Comments    Pt very pleasant & willing to participate in PT session.  Overall she moves very well but cont's to have difficulty with bed mobility.  Pt's husband present & is able to instruct wife on proper technique.  Cont with current POC.     Follow Up Recommendations  Home health PT;Supervision/Assistance - 24 hour     Equipment Recommendations  Hospital bed    Recommendations for Other Services       Precautions / Restrictions Precautions Precautions: Back Precaution Comments: Patient able to recall 2/3 precautions on her own. Husband able to recall all    Mobility  Bed Mobility Overal bed mobility: Needs Assistance Bed Mobility: Rolling;Sidelying to Sit;Sit to Sidelying Rolling: Min guard Sidelying to sit: Min assist Sit to sidelying: Min assist General bed mobility comments: Patient educated on log roll technique. Tends to roll knees over prior to shoulders causing increased pain. Support at hips to transition into upright sitting; utilized bed rail as pt's husband states they have rail on their bed.    Transfers Overall transfer level: Needs assistance Equipment used: Rolling walker (2 wheeled) Transfers: Sit to/from Stand Sit to Stand: Supervision         General transfer comment: Supervision for safety.   Ambulation/Gait Ambulation/Gait assistance: Supervision Ambulation Distance (Feet): 400 Feet Assistive device: Rolling walker (2 wheeled) Gait Pattern/deviations: Step-through pattern Gait velocity: decreased   General Gait Details: slow but steady.  encouragement  to relax shoulders   Stairs            Wheelchair Mobility    Modified Rankin (Stroke Patients Only)       Balance                                    Cognition Arousal/Alertness: Awake/alert Behavior During Therapy: WFL for tasks assessed/performed Overall Cognitive Status: History of cognitive impairments - at baseline       Memory: Decreased recall of precautions              Exercises      General Comments        Pertinent Vitals/Pain Faces Pain Scale: Hurts little more Pain Location: low back Pain Intervention(s): Monitored during session;Repositioned;Patient requesting pain meds-RN notified    Home Living                      Prior Function            PT Goals (current goals can now be found in the care plan section) Acute Rehab PT Goals Patient Stated Goal: decr back pain PT Goal Formulation: With patient/family Time For Goal Achievement: 04/03/14 Potential to Achieve Goals: Good Progress towards PT goals: Progressing toward goals    Frequency  Min 5X/week    PT Plan Current plan remains appropriate    Co-evaluation             End of Session   Activity Tolerance: Patient tolerated treatment well Patient left: in chair;with  call bell/phone within reach;with family/visitor present     Time: [id number] PT Time Calculation (min): 25 min  Charges:  $Gait Training: 8-22 mins $Therapeutic Activity: 8-22 mins                    G Codes:      Lara MulchCooper, Sydnie Sigmund Lynn 03/28/2014, 11:52 AM   Verdell FaceKelly Buster Schueller, PTA [phone] 03/28/2014

## 2014-03-28 NOTE — Progress Notes (Signed)
Patient complains of minimal back pain. She denies any radicular pain. Does feel some generalized weakness in her legs but no evidence of focal weakness. Patient was able to stand and walk in the hallway with physical therapy. She denies any numbness or tingling in her lower extremities. She is continent of bladder and bowel.  Awake and alert. She is oriented and reasonably appropriate. Her motor and sensory function are intact to direct testing. Back is moderately tender.  Status post T12 fracture complicated by recent vertebral plasty. patient with significant retropulsed bonebut no significant signs of myelopathy or radiculopathy at present.  Plan per Dr. Venetia MaxonStern to address situation tomorrow. Patient is doing quite well considering her imaging studies.

## 2014-03-29 MED ORDER — OXYCODONE HCL 5 MG PO TABS: 5.0000 mg | ORAL_TABLET | ORAL | Status: DC | PRN

## 2014-03-29 NOTE — Progress Notes (Signed)
Subjective: Patient reports "I hope he lets me go home today.  I just need help changing positions"  Objective: Vital signs in last 24 hours: Temp:  [97 F (36.1 C)-98.2 F (36.8 C)] 97 F (36.1 C) (11/09 0100) Pulse Rate:  [63-70] 70 (11/08 1755) Resp:  [16-18] 16 (11/09 0100) BP: (124-143)/(53-62) 126/59 mmHg (11/09 0100) SpO2:  [96 %-99 %] 99 % (11/09 0100)  Intake/Output from previous day: 11/08 0701 - 11/09 0700 In: 600 [P.O.:600] Out: -  Intake/Output this shift:    Alert, conversant. Reports mild to moderate mid back pain, controlled by positioning and medication. Strength is good BLE.   Lab Results: No results for input(s): WBC, HGB, HCT, PLT in the last 72 hours. BMET No results for input(s): NA, K, CL, CO2, GLUCOSE, BUN, CREATININE, CALCIUM in the last 72 hours.  Studies/Results: No results found.  Assessment/Plan:   LOS: 4 days  Pain controlled. Will await DrStern's assessment for disposition.   Rebecca Reed, Rebecca Reed 03/29/2014, 7:48 AM

## 2014-03-29 NOTE — Progress Notes (Signed)
Physical Therapy Treatment Patient Details Name: Rebecca Reed MRN: [id number] DOB: 09/13/1931 Today's Date: 03/29/2014    History of Present Illness Adm 03/26/11 after attempting to get OOB and had sharp back pain. Per MD note, [facility] which showed retropulsion of bone from T12 into canal. Adm for pain control. PMHx-s/p T 12 kyphoplasty 10/30; fall 12/2013, memory loss    PT Comments    Patient and husband thoroughly educated on bed mobility and log roll technique. Appeared to have less pain today with bed transition. Plan is to [facility] up with [facility]. Patients husband does not want hospital bed at home. Patient safe to D/C from a mobility standpoint based on progression towards goals set on PT eval.    Follow Up Recommendations  Home health PT;Supervision/Assistance - 24 hour     Equipment Recommendations   (Husband does not want hospital bed at this time')    Recommendations for Other Services       Precautions / Restrictions Precautions Precautions: Back Precaution Comments: Patient able to recall 2/3 precautions on her own. Husband able to recall all    Mobility  Bed Mobility Overal bed mobility: Needs Assistance   Rolling: Min guard (with rails) Sidelying to sit: Min assist       General bed mobility comments: Patient educated on log roll technique. Tends to roll knees over prior to shoulders causing increased pain. A for trunk to power up to EOB  Transfers Overall transfer level: Needs assistance Equipment used: Rolling walker (2 wheeled)   Sit to Stand: Supervision         General transfer comment: Supervision for safety.   Ambulation/Gait Ambulation/Gait assistance: Supervision Ambulation Distance (Feet): 600 Feet Assistive device: Rolling walker (2 wheeled)       General Gait Details: Cues for positoining inside of RW.    Stairs            Wheelchair Mobility    Modified Rankin (Stroke  Patients Only)       Balance                                    Cognition Arousal/Alertness: Awake/alert Behavior During Therapy: WFL for tasks assessed/performed Overall Cognitive Status: History of cognitive impairments - at baseline                      Exercises      General Comments        Pertinent Vitals/Pain Faces Pain Scale: Hurts little more Pain Location: low back Pain Intervention(s): Monitored during session;Repositioned    Home Living                      Prior Function            PT Goals (current goals can now be found in the care plan section) Progress towards PT goals: Progressing toward goals    Frequency  Min 5X/week    PT Plan Current plan remains appropriate    Co-evaluation             End of Session Equipment Utilized During Treatment: Gait belt Activity Tolerance: Patient tolerated treatment well Patient left: in chair;with call bell/phone within reach;with family/visitor present     Time: 1000-1028 PT Time Calculation (min): 28 min  Charges:  $Gait Training: 8-22 mins $  Therapeutic Activity: 8-22 mins                    G Codes:      Rebecca Reed, Rebecca Reed 03/29/2014, 10:32 AM 03/29/2014 Rebecca Reed, Rebecca Reed PTA [phone] pager [phone] office

## 2014-03-29 NOTE — Progress Notes (Signed)
Discharge orders received. Pt for discharge home today. IV d/c'd. Dressing clean, dry, intact to lower back. Pt given discharge instructions with verbalized understanding. Pt's husband did not want to take prescription for OxyIR; he does not want his wife to take that medication. Family in room to assist with discharge. Staff brought pt downstairs via wheelchair.

## 2014-03-29 NOTE — Discharge Summary (Signed)
Physician Discharge Summary  Patient ID: Rebecca Reed MRN: [id number] DOB/AGE: 78/06/1931 78 y.o.  Admit date: 03/25/2014 Discharge date: 03/29/2014  Admission Diagnoses:  Discharge Diagnoses:  Active Problems:   Compression fracture   Discharged Condition: fair  Hospital Course: Patient readmitted with flare of back pain.  CT showed retropulsed bone in spinal canal.  Neurologic exam has been normal and patient has been ambulatory.  Pain management has been sufficient with Tylenol.  Consults: None  Significant Diagnostic Studies: radiology: CT scan: AP  Treatments: therapies: PT and OT  Discharge Exam: Blood pressure 126/59, pulse 70, temperature 97 F (36.1 C), temperature source Oral, resp. rate 16, height 5\' 3"  (1.6 m), weight 69.809 kg (153 lb 14.4 oz), SpO2 99 %. Neurologic: Alert and oriented X 3, normal strength and tone. Normal symmetric reflexes. Normal coordination and gait Wound:CDI  Disposition: Independent Living with PT/OT     Medication List    TAKE these medications        acetaminophen 325 MG tablet  Commonly known as:  TYLENOL  Take 650 mg by mouth every 6 (six) hours as needed for mild pain.     amLODipine 10 MG tablet  Commonly known as:  NORVASC  Take 10 mg by mouth daily.     aspirin EC 81 MG tablet  Take 81 mg by mouth daily.     CALCIUM 600 + D 600-200 MG-UNIT Tabs  Generic drug:  Calcium Carb-Cholecalciferol  Take 1 tablet by mouth daily.     donepezil 5 MG tablet  Commonly known as:  ARICEPT  Take 5 mg by mouth at bedtime.     lisinopril 20 MG tablet  Commonly known as:  PRINIVIL,ZESTRIL  Take 20 mg by mouth daily.     multivitamin with minerals Tabs tablet  Take 1 tablet by mouth daily.     oxyCODONE 5 MG immediate release tablet  Commonly known as:  Oxy IR/ROXICODONE  Take 1 tablet (5 mg total) by mouth every 4 (four) hours as needed for severe pain.     simvastatin 10 MG tablet  Commonly known as:  ZOCOR  Take 10 mg  by mouth every morning.         Signed: Dorian HeckleSTERN,Finley Dinkel D, MD 03/29/2014, 10:22 AM

## 2014-03-29 NOTE — Progress Notes (Signed)
Patient is a resident of [facility] [phone]( [phone]); talked to Lurena JoinerRebecca at the rehab [facility] and the patient is already active with them for therapy; Therapy will resume 03/31/2014 at 9 am; Alexis GoodellB Jacquis Paxton RN,BSN,MHA [phone]

## 2014-08-28 IMAGING — US ULTRASOUND LEFT BREAST
1 series · 13 of 13 positions shown · non-contrast
Comparison: none

REASON FOR EXAM: LT BR NODULE FU
COMMENTS:

[Series 1: ultrasound left breast · 0.08mm/px · 13 of 13 slices shown]
[im 1/13]
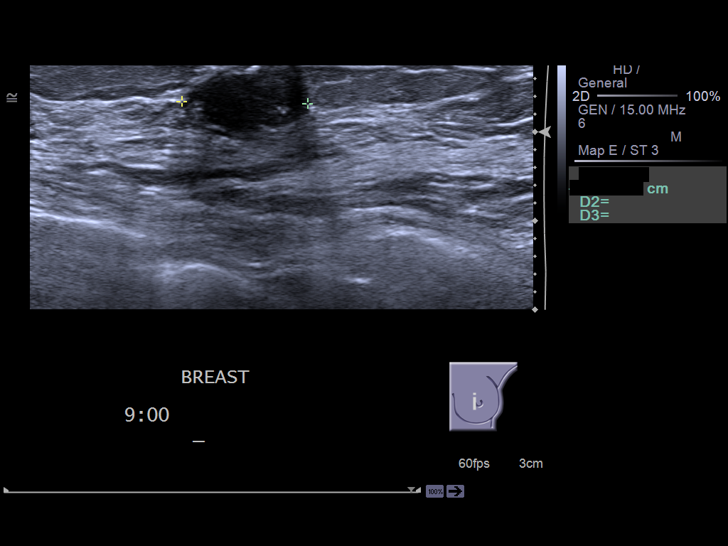
[im 2/13]
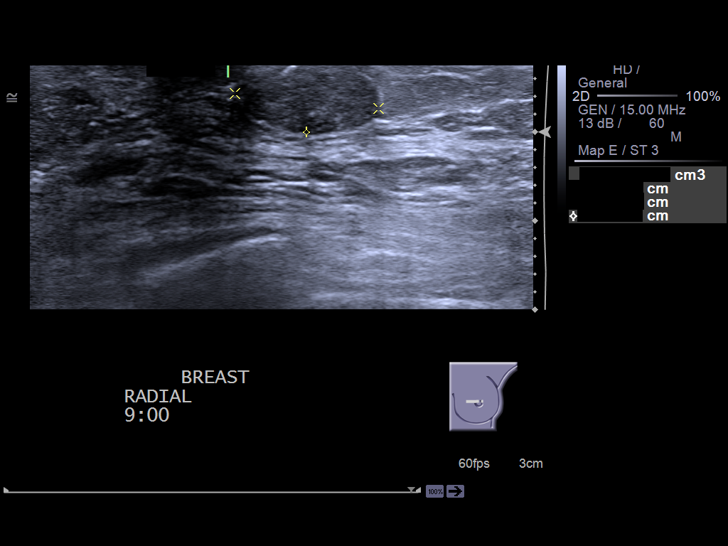
[im 3/13]
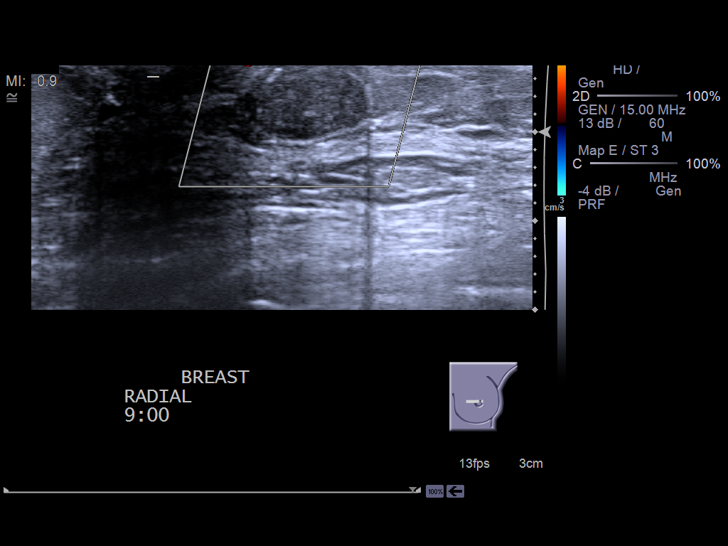
[im 4/13]
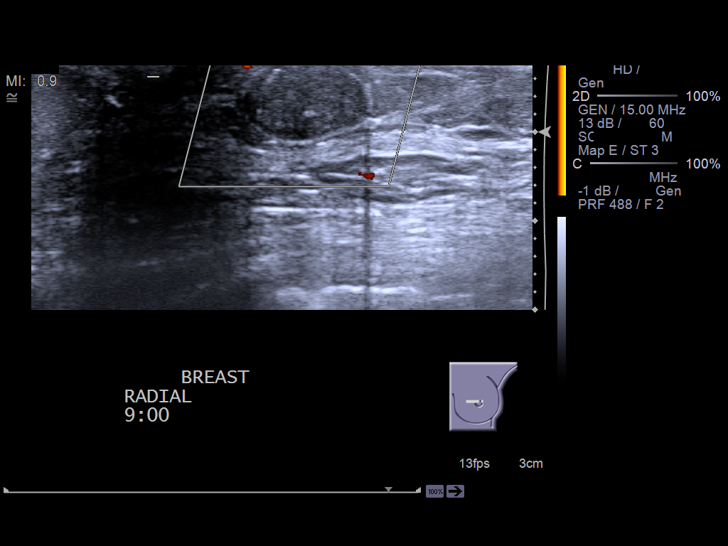
[im 5/13]
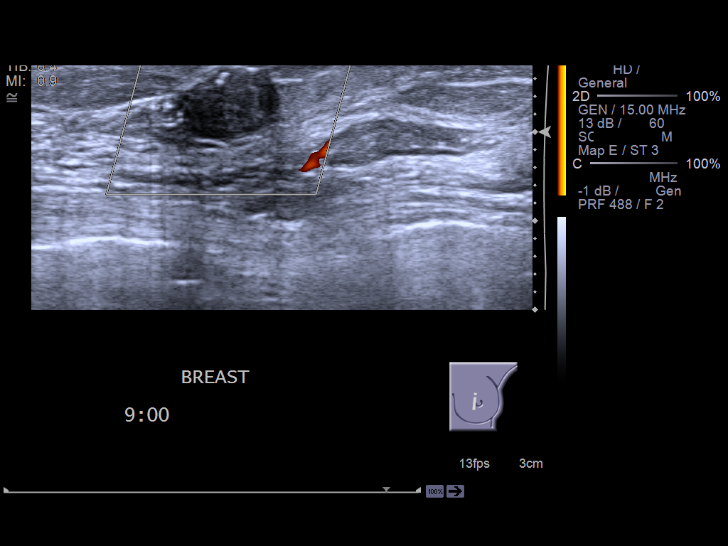
[im 6/13]
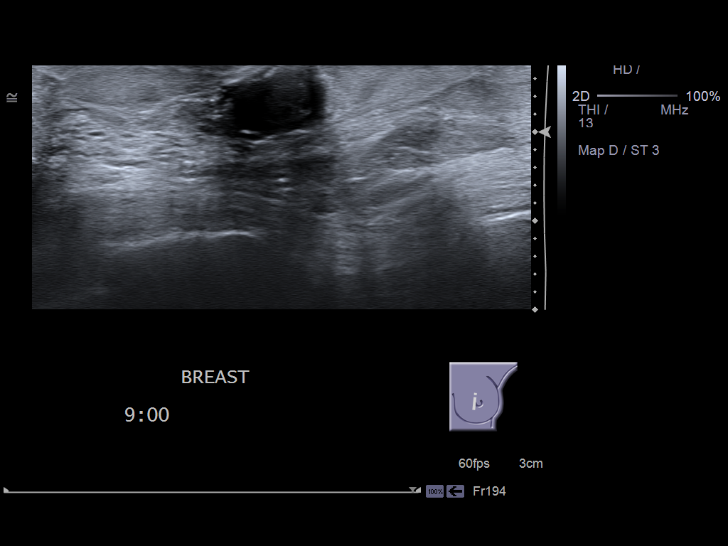
[im 7/13]
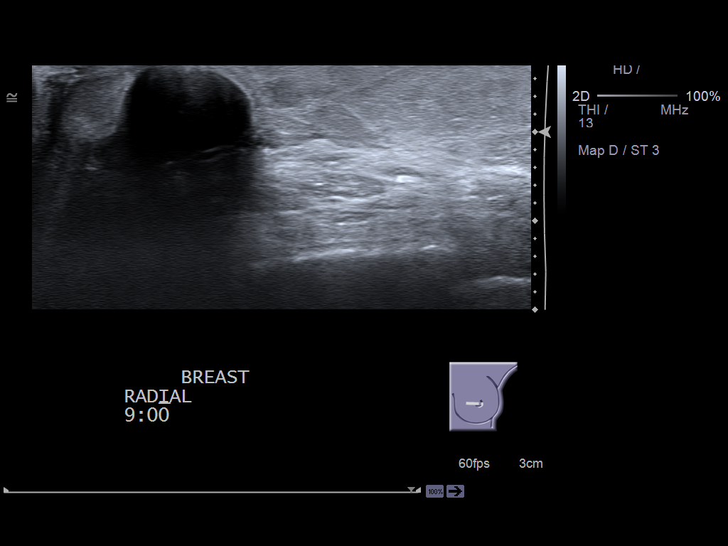
[im 8/13]
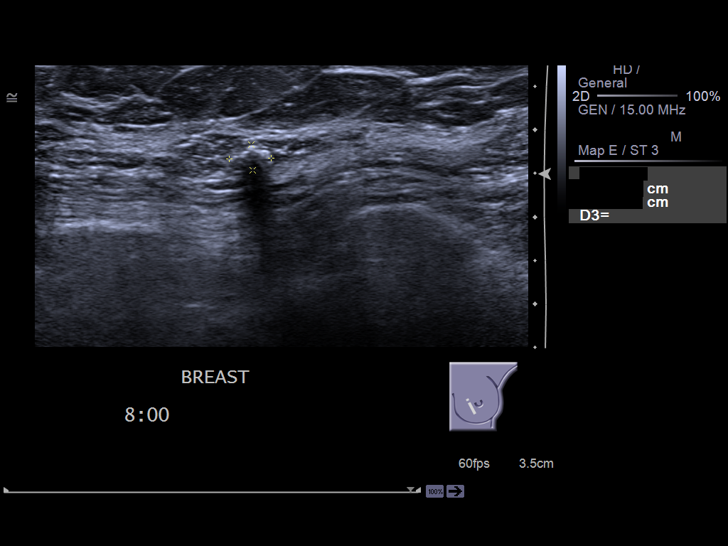
[im 9/13]
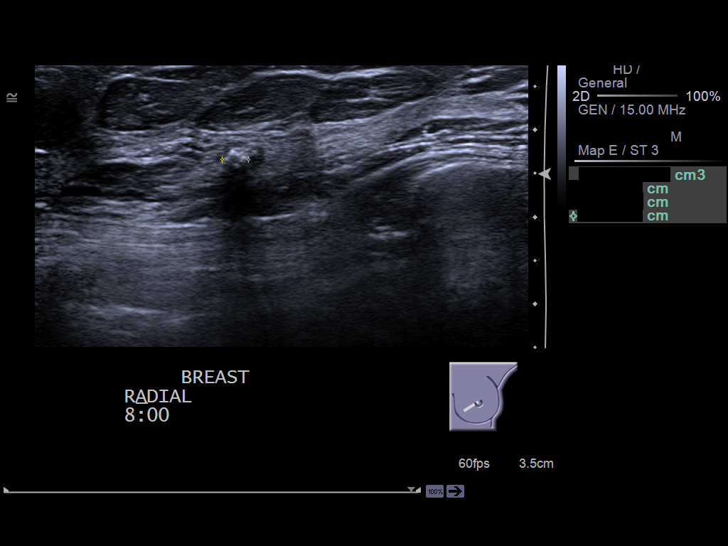
[im 10/13]
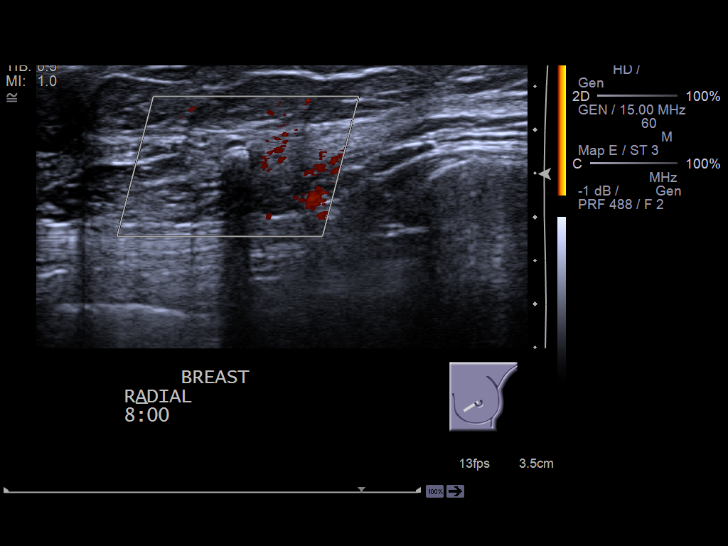
[im 11/13]
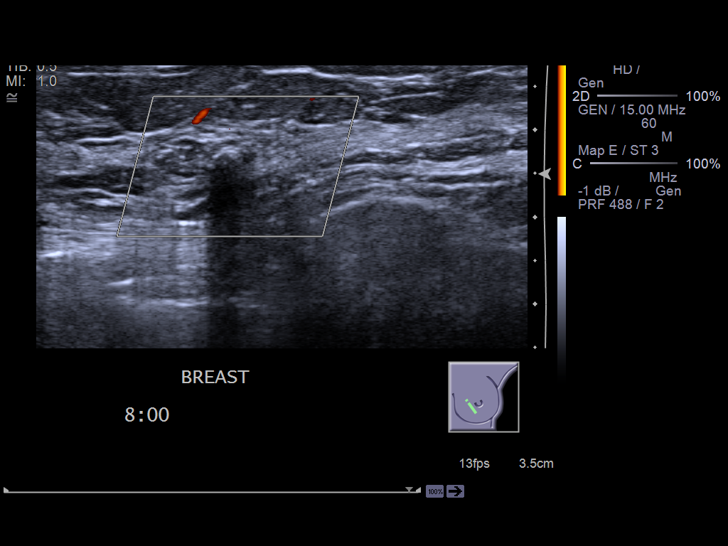
[im 12/13]
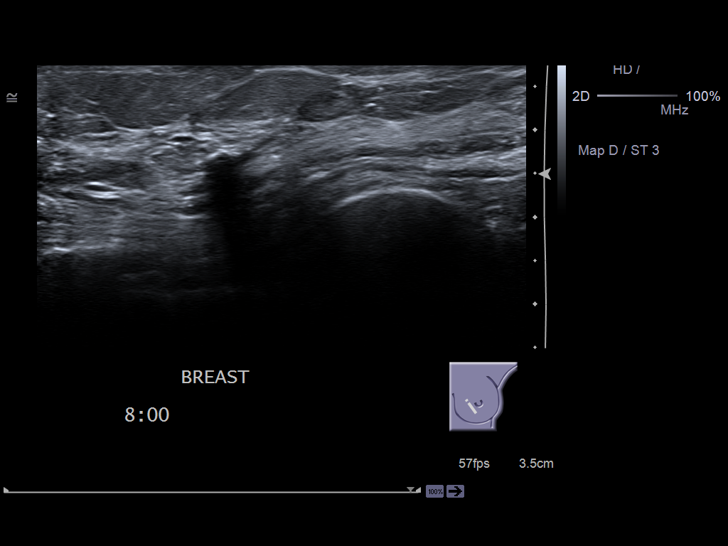
[im 13/13]
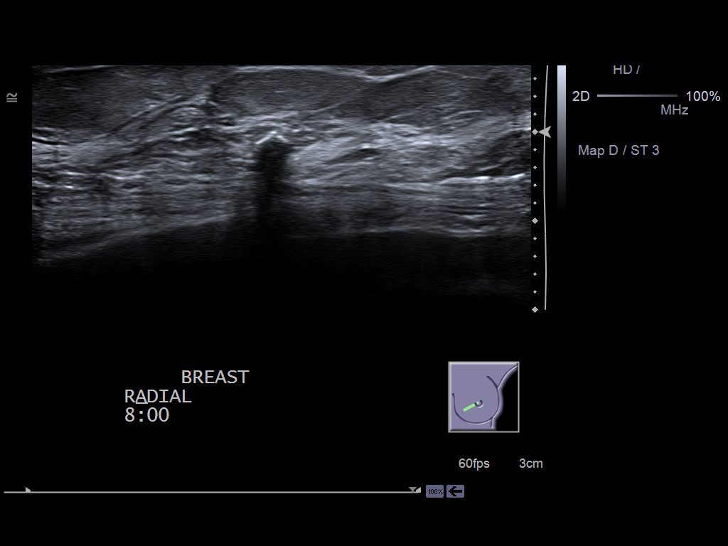

[13 of 13 positions shown; findings below may reference images not displayed]

PROCEDURE:     US  - US BREAST LEFT  - September 01, 2012 [DATE]

RESULT:     Comparison made to prior ultrasound of 10/24/2011 and 08/03/2008. A
solid approximately 1.4-1.5 cm lesion is present in the outer aspect of the
left breast and unchanged. Therefore this lesion is most likely benign.
Coarse calcification is noted consistent with benign calcification. These
findings correlate well with mammogram.
IMPRESSION: Stable exam. The patient can resume yearly followup exam

BI-RADS: Category 2- Benign Finding

A NEGATIVE MAMMOGRAM REPORT DOES NOT PRECLUDE BIOPSY OR OTHER EVALUATION OF
A CLINICALLY PALPABLE OR OTHERWISE SUSPICIOUS MASS OR LESION. BREAST CANCER
MAY NOT BE DETECTED IN UP TO 10% OF CASES.

## 2014-09-11 NOTE — H&P (Signed)
PATIENT NAME:  Rebecca Reed, WEDIN MR#:  [id number] DATE OF BIRTH:  1931/10/14  DATE OF ADMISSION:  01/14/2014  PRIMARY CARE PHYSICIAN: Katina Dung. Dayna Barker, MD  CHIEF COMPLAINT: Fall.   HISTORY OF PRESENT ILLNESS: An 81 year old female with hypertension, hyperlipidemia, memory loss mild who presents with the above complaint. The patient is in independent living, does not use a walker or assistive device to ambulate; however, this morning she woke up about 5:00, went to the bathroom, went back to her bed, and then about 8:00 she woke up and she had to use the restroom again and apparently fell from her bed to the bathroom. She had no symptoms prior to this. She did not trip on anything, but maybe her legs gave out. In any case, she has much difficulty getting up, but somehow was able to get up, but was in pain and unable to really move very much. She tried to get to the door when she had another mechanical fall.  REVIEW OF SYSTEMS: CONSTITUTIONAL: No fever.  EYES: No blurred or double vision, glaucoma.  EARS, NOSE, AND THROAT: No ear pain. Positive hearing loss. No seasonal allergies. No sinus pain.  RESPIRATORY: No cough, wheezing, hemoptysis, COPD. CARDIOVASCULAR: No chest pain, orthopnea, syncope, arrhythmia.  GASTROINTESTINAL: No nausea, vomiting, diarrhea, abdominal pain, melena, or ulcers. GENITOURINARY: No dysuria or hematuria.  ENDOCRINE: No polyuria or polydipsia.  HEMATOLOGIC AND LYMPHATICS: No bleeding or swollen glands. SKIN: No rash or lesions.   MUSCULOSKELETAL: She is complaining of back pain currently but usually has no limit in activity. NEUROLOGICAL: No history of CVA, TIA or seizures.  PSYCHIATRIC: No history of anxiety or depression.   PAST MEDICAL HISTORY: 1.  Hypertension. 2.  Hyperlipidemia.  3.  Memory loss.  4.  History of thrombocytopenia. 5.  Osteopenia.   PAST SURGICAL HISTORY:  1.  Knee replacement bilaterally.  2.  Abdominoplasty.  3.  Hysterectomy.    MEDICATIONS:  1.  Aspirin 81 mg daily.  2.  Calcium 1 tablet daily.  3.  Donepezil 5 mg at bedtime.  4.  Hydrochlorothiazide 25 mg daily.  5.  Lisinopril 20 mg daily.  6.  Multivitamin 1 tablet daily.  7.  Simvastatin 10 mg daily.   ALLERGIES: NO KNOWN DRUG ALLERGIES.   SOCIAL HISTORY: The patient lives with her husband in independent living. No tobacco, alcohol or IV drug use.   PHYSICAL EXAMINATION:  VITAL SIGNS: Temperature is 97.8, pulse is 62, respirations 20, blood pressure 152/64, and 100% on room air.  GENERAL: The patient is alert, oriented, not in acute distress. HEENT: Head is atraumatic. Pupils are round. Sclerae are anicteric. Mucous membranes are moist. Oropharynx is clear.  NECK: Supple without JVD, carotid bruit or enlarged thyroid.  CARDIOVASCULAR: Regular rate and rhythm. No murmurs, gallops or rubs. PMI is not displaced. LUNGS: Anteriorly are clear to auscultation. There is no wheezing, crackles, rales heard. Normal chest expansion. ABDOMEN: Bowel sounds are positive. Nontender, nondistended. No hepatosplenomegaly.  EXTREMITIES: No clubbing, cyanosis or edema.  SKIN: No rash or lesions.  NEUROLOGICAL: Cranial nerves II-XII are intact. There are no focal deficits.  MUSCULOSKELETAL: Strength 5/5 in her upper extremities. Unfortunately, due to back pain she is unable to lift her lower extremities due to the pain.   LABORATORY DATA: Urinalysis is negative for LCE or nitrites.   White blood cells 6.3, hemoglobin 15.3, hematocrit 47, platelets are 134,000. Sodium 143, potassium 3.4, chloride 110, bicarbonate 29, BUN 17, creatinine 1.01, glucose is 122. ALT  28, alkaline phosphatase 61, bilirubin 1.0, calcium 9.2, AST 45, total protein 8.1, albumin is 4.3, TSH 0.801. Troponin less than 0.03.   EKG: Normal sinus rhythm with a left bundle branch block.   CT thoracic spine shows a T12 moderate acute compression fracture.   Chest x-ray shows no acute cardiopulmonary  disease.   Lung burst x-ray shows no evidence of fractures in the lumbar spine.   ASSESSMENT AND PLAN: An 81 year old female who had suffered a fall x 2, it sounds like a mechanical fall, who unfortunately has suffered an acute T12 compression fracture.  1.  Intractable back pain secondary to a T12 acute compression fracture. The patient will be admitted for pain control. Dr. Rosita KeaMenz has been contacted by the ER physician. Plan is for possible kyphoplasty pending MRI of the lumbar spine. The patient will need physical therapy consultation, which I placed.  2.  T12 compression fracture after mechanical fall. The patient will need pain control, MRI and Dr. Rosita KeaMenz will see the patient to see if she is a candidate for kyphoplasty.  3.  Hypertension. We will continue hydrochlorothiazide/lisinopril.  4.  Osteopenia. We will continue calcium.  5.  Memory loss. We will continue donepezil.  6.  Hyperlipidemia. We will continue simvastatin.  CODE STATUS: The patient is a full code status.  TIME SPENT: Approximately 45 minutes.    ____________________________ Janyth ContesSital P. Juliene PinaMody, MD spm:TT D: 01/14/2014 14:20:58 ET T: 01/14/2014 15:29:54 ET JOB#: [id number]  cc: Alexxa Sabet P. Juliene PinaMody, MD, <Dictator> Katina DungBarbara D. Dayna BarkerAldridge, MD Janyth ContesSITAL P Brave Dack MD ELECTRONICALLY SIGNED 01/14/2014 16:17

## 2014-09-11 NOTE — Discharge Summary (Signed)
PATIENT NAME:  Rebecca Reed, Rebecca Reed MR#:  [id number] DATE OF BIRTH:  May 05, 1932  DATE OF ADMISSION:  01/14/2014 DATE OF DISCHARGE:    ADMITTING DIAGNOSIS:  T12 compression fracture.   DISCHARGE DIAGNOSES: 1.  Epidural hematoma.  2.  Hypertension.  3.  Hyperlipidemia.  4.  Memory loss.   CONSULTATIONS:  Leitha SchullerMichael J Menz, MD   MRI of the thoracic spine showed stable burst fracture at T11, and posterior epidural hematoma extending from T8 through T11.   HOSPITAL COURSE:  An 81 year old female presented with back pain and a fall thought to have a T12 compression fracture seen on thoracic spine x-ray; however, was found to have an epidural hematoma. For further details, please refer to the H and P.   Intractable back pain. Patient underwent an MRI. She actually has an epidural hematoma from T8 through T11. I have talked to Dr. Venetia MaxonStern, neurosurgeon [facility].  [facility]. I have spoken with the family who is in agreement for transfer.  I have also spoken with Dr. Rosita KeaMenz.   MEDICATIONS: 1.  Donepezil 5 mg at bedtime.  2.  Hydrochlorothiazide 25 mg daily.  3.  Lisinopril 20 mg daily.  4.  Simvastatin 10 mg daily.  5.  Calcium carbonate 1 tablet p.o. b.i.d.  6.  We are holding the aspirin 81 mg daily.   The patient is a full CODE STATUS. [facility], Dr. Venetia MaxonStern is accepting physician.   Time Spent:  Was 60 minutes.     ____________________________ Janyth ContesSital P. Juliene PinaMody, MD spm:nt D: 01/14/2014 18:02:48 ET T: 01/14/2014 18:36:08 ET JOB#: [id number]  cc: Quiera Diffee P. Juliene PinaMody, MD, <Dictator> Janyth ContesSITAL P Delina Kruczek MD ELECTRONICALLY SIGNED 01/14/2014 19:15

## 2014-11-22 ENCOUNTER — Inpatient Hospital Stay: Payer: Medicare Other | Admitting: Anesthesiology

## 2014-11-22 ENCOUNTER — Inpatient Hospital Stay: Payer: Medicare Other

## 2014-11-22 ENCOUNTER — Encounter
Admission: EM | Disposition: A | Discharge status: Skilled Nursing Facility | Payer: Self-pay | Source: Home / Self Care | Attending: Specialist

## 2014-11-22 ENCOUNTER — Encounter: Payer: Self-pay | Admitting: Radiology

## 2014-11-22 ENCOUNTER — Emergency Department: Payer: Medicare Other

## 2014-11-22 ENCOUNTER — Inpatient Hospital Stay
Admission: EM | Admit: 2014-11-22 | Discharge: 2014-11-25 | DRG: 470 | Disposition: A | Discharge status: Skilled Nursing Facility | Payer: Medicare Other | Attending: Specialist | Admitting: Specialist

## 2014-11-22 DIAGNOSIS — M1990 Unspecified osteoarthritis, unspecified site: Secondary | ICD-10-CM | POA: Diagnosis present

## 2014-11-22 DIAGNOSIS — W1830XA Fall on same level, unspecified, initial encounter: Secondary | ICD-10-CM | POA: Diagnosis present

## 2014-11-22 DIAGNOSIS — K5900 Constipation, unspecified: Secondary | ICD-10-CM | POA: Diagnosis not present

## 2014-11-22 DIAGNOSIS — Y9389 Activity, other specified: Secondary | ICD-10-CM

## 2014-11-22 DIAGNOSIS — E785 Hyperlipidemia, unspecified: Secondary | ICD-10-CM | POA: Diagnosis present

## 2014-11-22 DIAGNOSIS — Z66 Do not resuscitate: Secondary | ICD-10-CM | POA: Diagnosis present

## 2014-11-22 DIAGNOSIS — T50995A Adverse effect of other drugs, medicaments and biological substances, initial encounter: Secondary | ICD-10-CM | POA: Diagnosis not present

## 2014-11-22 DIAGNOSIS — Z79891 Long term (current) use of opiate analgesic: Secondary | ICD-10-CM | POA: Diagnosis not present

## 2014-11-22 DIAGNOSIS — I447 Left bundle-branch block, unspecified: Secondary | ICD-10-CM | POA: Diagnosis present

## 2014-11-22 DIAGNOSIS — F0390 Unspecified dementia without behavioral disturbance: Secondary | ICD-10-CM | POA: Diagnosis present

## 2014-11-22 DIAGNOSIS — Z96653 Presence of artificial knee joint, bilateral: Secondary | ICD-10-CM | POA: Diagnosis present

## 2014-11-22 DIAGNOSIS — Y92008 Other place in unspecified non-institutional (private) residence as the place of occurrence of the external cause: Secondary | ICD-10-CM | POA: Diagnosis not present

## 2014-11-22 DIAGNOSIS — D696 Thrombocytopenia, unspecified: Secondary | ICD-10-CM | POA: Diagnosis present

## 2014-11-22 DIAGNOSIS — Z7982 Long term (current) use of aspirin: Secondary | ICD-10-CM | POA: Diagnosis not present

## 2014-11-22 DIAGNOSIS — I10 Essential (primary) hypertension: Secondary | ICD-10-CM | POA: Diagnosis present

## 2014-11-22 DIAGNOSIS — S72001A Fracture of unspecified part of neck of right femur, initial encounter for closed fracture: Principal | ICD-10-CM | POA: Diagnosis present

## 2014-11-22 DIAGNOSIS — Z87891 Personal history of nicotine dependence: Secondary | ICD-10-CM | POA: Diagnosis not present

## 2014-11-22 DIAGNOSIS — S42201A Unspecified fracture of upper end of right humerus, initial encounter for closed fracture: Secondary | ICD-10-CM | POA: Diagnosis present

## 2014-11-22 DIAGNOSIS — S72041A Displaced fracture of base of neck of right femur, initial encounter for closed fracture: Secondary | ICD-10-CM

## 2014-11-22 DIAGNOSIS — S42301A Unspecified fracture of shaft of humerus, right arm, initial encounter for closed fracture: Secondary | ICD-10-CM

## 2014-11-22 DIAGNOSIS — Z09 Encounter for follow-up examination after completed treatment for conditions other than malignant neoplasm: Secondary | ICD-10-CM

## 2014-11-22 HISTORY — PX: HIP ARTHROPLASTY: SHX981

## 2014-11-22 LAB — CBC WITH DIFFERENTIAL/PLATELET
BASOS PCT: 1 %
Basophils Absolute: 0 10*3/uL (ref 0–0.1)
EOS ABS: 0.1 10*3/uL (ref 0–0.7)
EOS PCT: 3 %
HCT: 44.1 % (ref 35.0–47.0)
Hemoglobin: 14.9 g/dL (ref 12.0–16.0)
LYMPHS ABS: 2.3 10*3/uL (ref 1.0–3.6)
Lymphocytes Relative: 45 %
MCH: 29.8 pg (ref 26.0–34.0)
MCHC: 33.8 g/dL (ref 32.0–36.0)
MCV: 88.4 fL (ref 80.0–100.0)
Monocytes Absolute: 0.3 10*3/uL (ref 0.2–0.9)
Monocytes Relative: 6 %
Neutro Abs: 2.3 10*3/uL (ref 1.4–6.5)
Neutrophils Relative %: 45 %
PLATELETS: 144 10*3/uL — AB (ref 150–440)
RBC: 4.99 MIL/uL (ref 3.80–5.20)
RDW: 14 % (ref 11.5–14.5)
WBC: 5 10*3/uL (ref 3.6–11.0)

## 2014-11-22 LAB — URINALYSIS COMPLETE WITH MICROSCOPIC (ARMC ONLY)
Bacteria, UA: NONE SEEN
Bilirubin Urine: NEGATIVE
Glucose, UA: NEGATIVE mg/dL
HGB URINE DIPSTICK: NEGATIVE
Ketones, ur: NEGATIVE mg/dL
Leukocytes, UA: NEGATIVE
Nitrite: NEGATIVE
PH: 9 — AB (ref 5.0–8.0)
Protein, ur: NEGATIVE mg/dL
Specific Gravity, Urine: 1.01 (ref 1.005–1.030)
Squamous Epithelial / LPF: NONE SEEN

## 2014-11-22 LAB — COMPREHENSIVE METABOLIC PANEL
ALBUMIN: 5.3 g/dL — AB (ref 3.5–5.0)
ALK PHOS: 50 U/L (ref 38–126)
ALT: 21 U/L (ref 14–54)
AST: 32 U/L (ref 15–41)
Anion gap: 15 (ref 5–15)
BUN: 26 mg/dL — AB (ref 6–20)
CO2: 25 mmol/L (ref 22–32)
CREATININE: 1 mg/dL (ref 0.44–1.00)
Calcium: 9.9 mg/dL (ref 8.9–10.3)
Chloride: 101 mmol/L (ref 101–111)
GFR calc Af Amer: 60 mL/min — ABNORMAL LOW (ref >60)
GFR, EST NON AFRICAN AMERICAN: 51 mL/min — AB (ref >60)
GLUCOSE: 121 mg/dL — AB (ref 65–99)
POTASSIUM: 3.2 mmol/L — AB (ref 3.5–5.1)
SODIUM: 141 mmol/L (ref 135–145)
Total Bilirubin: 0.9 mg/dL (ref 0.3–1.2)
Total Protein: 8.3 g/dL — ABNORMAL HIGH (ref 6.5–8.1)

## 2014-11-22 LAB — APTT: APTT: 31 s (ref 24–36)

## 2014-11-22 LAB — ABO/RH: ABO/RH(D): A POS

## 2014-11-22 LAB — TYPE AND SCREEN
ABO/RH(D): A POS
Antibody Screen: NEGATIVE

## 2014-11-22 LAB — PROTIME-INR
INR: 0.94
Prothrombin Time: 12.8 seconds (ref 11.4–15.0)

## 2014-11-22 LAB — TROPONIN I

## 2014-11-22 SURGERY — HEMIARTHROPLASTY, HIP, DIRECT ANTERIOR APPROACH, FOR FRACTURE
Anesthesia: Spinal | Site: Hip | Laterality: Right | Wound class: Clean

## 2014-11-22 MED ORDER — ONDANSETRON HCL 4 MG/2ML IJ SOLN
4.0000 mg | Freq: Once | INTRAMUSCULAR | Status: AC
  Administered 2014-11-22: 4 mg via INTRAVENOUS

## 2014-11-22 MED ORDER — ADULT MULTIVITAMIN W/MINERALS CH
1.0000 | ORAL_TABLET | Freq: Every day | ORAL | Status: DC
  Administered 2014-11-23 – 2014-11-25 (×3): 1 via ORAL
  Filled 2014-11-22 (×3): qty 1

## 2014-11-22 MED ORDER — ESMOLOL HCL 10 MG/ML IV SOLN
INTRAVENOUS | Status: DC | PRN
  Administered 2014-11-22: 10 mg via INTRAVENOUS

## 2014-11-22 MED ORDER — ASPIRIN 325 MG PO TABS
325.0000 mg | ORAL_TABLET | Freq: Every day | ORAL | Status: DC
  Administered 2014-11-23: 325 mg via ORAL
  Filled 2014-11-22 (×5): qty 1

## 2014-11-22 MED ORDER — ONDANSETRON HCL 4 MG/2ML IJ SOLN: 4.0000 mg | Freq: Once | INTRAMUSCULAR | Status: AC | PRN

## 2014-11-22 MED ORDER — BUPIVACAINE-EPINEPHRINE (PF) 0.25% -1:200000 IJ SOLN
INTRAMUSCULAR | Status: DC | PRN
  Administered 2014-11-22: 30 mL

## 2014-11-22 MED ORDER — ONDANSETRON HCL 4 MG PO TABS: 4.0000 mg | ORAL_TABLET | Freq: Four times a day (QID) | ORAL | Status: DC | PRN

## 2014-11-22 MED ORDER — AMLODIPINE BESYLATE 10 MG PO TABS
10.0000 mg | ORAL_TABLET | Freq: Every day | ORAL | Status: DC
  Administered 2014-11-23 – 2014-11-25 (×3): 10 mg via ORAL
  Filled 2014-11-22 (×3): qty 1

## 2014-11-22 MED ORDER — FENTANYL CITRATE (PF) 100 MCG/2ML IJ SOLN
25.0000 ug | INTRAMUSCULAR | Status: DC | PRN
Start: 2014-11-22 — End: 2014-11-23

## 2014-11-22 MED ORDER — LORAZEPAM 2 MG/ML IJ SOLN
INTRAMUSCULAR | Status: AC
  Filled 2014-11-22: qty 1

## 2014-11-22 MED ORDER — PROPOFOL INFUSION 10 MG/ML OPTIME
INTRAVENOUS | Status: DC | PRN
  Administered 2014-11-22: 50 ug/kg/min via INTRAVENOUS

## 2014-11-22 MED ORDER — BUPIVACAINE HCL (PF) 0.5 % IJ SOLN
INTRAMUSCULAR | Status: DC | PRN
  Administered 2014-11-22: 3 mL

## 2014-11-22 MED ORDER — OXYBUTYNIN CHLORIDE ER 5 MG PO TB24
5.0000 mg | ORAL_TABLET | Freq: Every day | ORAL | Status: DC
  Administered 2014-11-23 – 2014-11-24 (×3): 5 mg via ORAL
  Filled 2014-11-22 (×3): qty 1

## 2014-11-22 MED ORDER — DONEPEZIL HCL 5 MG PO TABS
10.0000 mg | ORAL_TABLET | Freq: Every day | ORAL | Status: DC
  Administered 2014-11-23 – 2014-11-24 (×3): 10 mg via ORAL
  Filled 2014-11-22 (×3): qty 2

## 2014-11-22 MED ORDER — LISINOPRIL 20 MG PO TABS
20.0000 mg | ORAL_TABLET | Freq: Every day | ORAL | Status: DC
  Administered 2014-11-23 – 2014-11-25 (×3): 20 mg via ORAL
  Filled 2014-11-22 (×3): qty 1

## 2014-11-22 MED ORDER — MORPHINE SULFATE 2 MG/ML IJ SOLN: 2.0000 mg | INTRAMUSCULAR | Status: DC | PRN

## 2014-11-22 MED ORDER — CALCIUM CARBONATE-VITAMIN D 500-200 MG-UNIT PO TABS
1.0000 | ORAL_TABLET | ORAL | Status: DC
  Administered 2014-11-23 – 2014-11-25 (×4): 1 via ORAL
  Filled 2014-11-22 (×6): qty 1

## 2014-11-22 MED ORDER — ONDANSETRON HCL 4 MG/2ML IJ SOLN
INTRAMUSCULAR | Status: AC
  Administered 2014-11-22: 4 mg via INTRAVENOUS
  Filled 2014-11-22: qty 2

## 2014-11-22 MED ORDER — PHENYLEPHRINE HCL 10 MG/ML IJ SOLN
INTRAMUSCULAR | Status: DC | PRN
  Administered 2014-11-22: 50 ug via INTRAVENOUS

## 2014-11-22 MED ORDER — ACETAMINOPHEN 650 MG RE SUPP: 650.0000 mg | Freq: Four times a day (QID) | RECTAL | Status: DC | PRN

## 2014-11-22 MED ORDER — MORPHINE SULFATE 2 MG/ML IJ SOLN
2.0000 mg | Freq: Once | INTRAMUSCULAR | Status: AC
  Administered 2014-11-22: 2 mg via INTRAVENOUS

## 2014-11-22 MED ORDER — TRANEXAMIC ACID 1000 MG/10ML IV SOLN: 1000.0000 mg | INTRAVENOUS | Status: DC

## 2014-11-22 MED ORDER — CEFAZOLIN SODIUM 1-5 GM-% IV SOLN
INTRAVENOUS | Status: DC | PRN
  Administered 2014-11-22: 1 g via INTRAVENOUS

## 2014-11-22 MED ORDER — LORATADINE 5 MG/5ML PO SYRP
5.0000 mg | ORAL_SOLUTION | Freq: Every day | ORAL | Status: DC | PRN
  Filled 2014-11-22: qty 5

## 2014-11-22 MED ORDER — POLYVINYL ALCOHOL 1.4 % OP SOLN
1.0000 [drp] | Freq: Every morning | OPHTHALMIC | Status: DC
  Administered 2014-11-22: 1 [drp] via OPHTHALMIC
  Administered 2014-11-23: 2 [drp] via OPHTHALMIC
  Administered 2014-11-24: 1 [drp] via OPHTHALMIC
  Administered 2014-11-25: 2 [drp] via OPHTHALMIC
  Filled 2014-11-22: qty 15

## 2014-11-22 MED ORDER — SODIUM CHLORIDE 0.9 % IV SOLN
1000.0000 mg | INTRAVENOUS | Status: DC
Start: 2014-11-22 — End: 2014-11-23
  Filled 2014-11-22: qty 10

## 2014-11-22 MED ORDER — MORPHINE SULFATE 2 MG/ML IJ SOLN
INTRAMUSCULAR | Status: AC
Start: 2014-11-22 — End: 2014-11-22
  Administered 2014-11-22: 2 mg via INTRAVENOUS
  Filled 2014-11-22: qty 1

## 2014-11-22 MED ORDER — BACITRACIN 50000 UNITS IM SOLR
INTRAMUSCULAR | Status: DC | PRN
  Administered 2014-11-22: 1000 mL

## 2014-11-22 MED ORDER — ONDANSETRON HCL 4 MG/2ML IJ SOLN
4.0000 mg | Freq: Four times a day (QID) | INTRAMUSCULAR | Status: DC | PRN
Start: 2014-11-22 — End: 2014-11-25

## 2014-11-22 MED ORDER — DOCUSATE SODIUM 100 MG PO CAPS
100.0000 mg | ORAL_CAPSULE | ORAL | Status: DC
  Administered 2014-11-23 – 2014-11-25 (×4): 100 mg via ORAL
  Filled 2014-11-22 (×4): qty 1

## 2014-11-22 MED ORDER — LACTATED RINGERS IV SOLN
INTRAVENOUS | Status: DC | PRN
  Administered 2014-11-22 (×2): via INTRAVENOUS

## 2014-11-22 MED ORDER — PROPOFOL 10 MG/ML IV BOLUS
INTRAVENOUS | Status: DC | PRN
  Administered 2014-11-22: 50 mg via INTRAVENOUS
  Administered 2014-11-22: 20 mg via INTRAVENOUS
  Administered 2014-11-22: 40 mg via INTRAVENOUS

## 2014-11-22 MED ORDER — ACETAMINOPHEN 325 MG PO TABS
650.0000 mg | ORAL_TABLET | Freq: Four times a day (QID) | ORAL | Status: DC | PRN
  Administered 2014-11-23 – 2014-11-25 (×5): 650 mg via ORAL
  Filled 2014-11-22 (×5): qty 2

## 2014-11-22 MED ORDER — TRANEXAMIC ACID 1000 MG/10ML IV SOLN
1000.0000 mg | INTRAVENOUS | Status: AC
  Administered 2014-11-22: 1000 mg via INTRAVENOUS
  Filled 2014-11-22: qty 10

## 2014-11-22 MED ORDER — SIMVASTATIN 20 MG PO TABS
10.0000 mg | ORAL_TABLET | Freq: Every day | ORAL | Status: DC
  Administered 2014-11-23 – 2014-11-24 (×3): 10 mg via ORAL
  Filled 2014-11-22 (×3): qty 1

## 2014-11-22 MED ORDER — POTASSIUM CHLORIDE IN NACL 20-0.9 MEQ/L-% IV SOLN
INTRAVENOUS | Status: DC
  Administered 2014-11-22 – 2014-11-23 (×2): via INTRAVENOUS
  Filled 2014-11-22 (×8): qty 1000

## 2014-11-22 MED ORDER — EPHEDRINE SULFATE 50 MG/ML IJ SOLN
INTRAMUSCULAR | Status: DC | PRN
  Administered 2014-11-22: 5 mg via INTRAVENOUS
  Administered 2014-11-22: 10 mg via INTRAVENOUS

## 2014-11-22 MED ORDER — LORAZEPAM 2 MG/ML IJ SOLN
0.5000 mg | Freq: Once | INTRAMUSCULAR | Status: AC
  Administered 2014-11-22: 08:00:00 via INTRAVENOUS

## 2014-11-22 MED ORDER — FENTANYL CITRATE (PF) 100 MCG/2ML IJ SOLN
INTRAMUSCULAR | Status: DC | PRN
  Administered 2014-11-22: 50 ug via INTRAVENOUS

## 2014-11-22 SURGICAL SUPPLY — 69 items
BAG DECANTER STRL (MISCELLANEOUS) ×3 IMPLANT
BLADE SAW 1 (BLADE) ×6 IMPLANT
BUR SURG ROUTER 3X19 TPS (BURR) ×3 IMPLANT
CANISTER SUCT 1200ML W/VALVE (MISCELLANEOUS) ×3 IMPLANT
CANISTER SUCT 3000ML (MISCELLANEOUS) ×6 IMPLANT
CAPT HIP HEMI 1 ×3 IMPLANT
CATH FOL LEG HOLDER (MISCELLANEOUS) ×3 IMPLANT
CEMENT HV SMART SET (Cement) ×12 IMPLANT
DRAPE INCISE IOBAN 66X45 STRL (DRAPES) ×3 IMPLANT
DRAPE INCISE IOBAN 66X60 STRL (DRAPES) ×3 IMPLANT
DRAPE SHEET LG 3/4 BI-LAMINATE (DRAPES) ×3 IMPLANT
DRAPE TABLE BACK 80X90 (DRAPES) ×3 IMPLANT
DRSG AQUACEL AG ADV 3.5X14 (GAUZE/BANDAGES/DRESSINGS) ×3 IMPLANT
DRSG DERMACEA 8X12 NADH (GAUZE/BANDAGES/DRESSINGS) ×3 IMPLANT
DRSG OPSITE POSTOP 4X12 (GAUZE/BANDAGES/DRESSINGS) ×3 IMPLANT
DRSG OPSITE POSTOP 4X14 (GAUZE/BANDAGES/DRESSINGS) ×3 IMPLANT
DRSG TEGADERM 4X4.75 (GAUZE/BANDAGES/DRESSINGS) ×3 IMPLANT
DURAPREP 26ML APPLICATOR (WOUND CARE) ×6 IMPLANT
ELECT BLADE 6.5 EXT (BLADE) ×3 IMPLANT
ELECT CAUTERY BLADE 6.4 (BLADE) ×6 IMPLANT
FEMORAL CANAL TIP ×3 IMPLANT
GAUZE PACK 2X3YD (MISCELLANEOUS) ×3 IMPLANT
GAUZE SPONGE 4X4 12PLY STRL (GAUZE/BANDAGES/DRESSINGS) ×3 IMPLANT
GLOVE BIO SURGEON STRL SZ8 (GLOVE) ×9 IMPLANT
GLOVE BIOGEL M STRL SZ7.5 (GLOVE) ×6 IMPLANT
GLOVE BIOGEL PI IND STRL 9 (GLOVE) ×1 IMPLANT
GLOVE BIOGEL PI INDICATOR 9 (GLOVE) ×2
GLOVE INDICATOR 8.0 STRL GRN (GLOVE) ×3 IMPLANT
GOWN STRL REUS W/ TWL LRG LVL3 (GOWN DISPOSABLE) ×1 IMPLANT
GOWN STRL REUS W/TWL 2XL LVL3 (GOWN DISPOSABLE) ×3 IMPLANT
GOWN STRL REUS W/TWL LRG LVL3 (GOWN DISPOSABLE) ×2
GOWN STRL REUS W/TWL LRG LVL4 (GOWN DISPOSABLE) ×3 IMPLANT
GOWN STRL REUS W/TWL XL LVL4 (GOWN DISPOSABLE) ×3 IMPLANT
HANDPIECE SUCTION TUBG SURGILV (MISCELLANEOUS) ×6 IMPLANT
HEMOVAC 400CC 10FR (MISCELLANEOUS) ×3 IMPLANT
HOOD PEEL AWAY FACE SHEILD DIS (HOOD) ×9 IMPLANT
HOOD PEEL AWAY FACE SHIELD ×9 IMPLANT
KIT RM TURNOVER STRD PROC AR (KITS) ×3 IMPLANT
MEGADYNE ×2 IMPLANT
NDL SAFETY 18GX1.5 (NEEDLE) ×3 IMPLANT
NDL SAFETY ECLIPSE 18X1.5 (NEEDLE) ×1 IMPLANT
NEEDLE FILTER BLUNT 18X 1/2SAF (NEEDLE) ×2
NEEDLE FILTER BLUNT 18X1 1/2 (NEEDLE) ×1 IMPLANT
NEEDLE HYPO 18GX1.5 SHARP (NEEDLE) ×2
NS IRRIG 1000ML POUR BTL (IV SOLUTION) ×3 IMPLANT
PACK HIP PROSTHESIS (MISCELLANEOUS) ×3 IMPLANT
PAD GROUND ADULT SPLIT (MISCELLANEOUS) ×3 IMPLANT
PILLOW ABDUC M (MISCELLANEOUS) ×3 IMPLANT
PRESSURIZER CEMENT PROX FEM SM (MISCELLANEOUS) ×3 IMPLANT
PRESSURIZER FEM CANAL M (MISCELLANEOUS) ×3 IMPLANT
SNAP KOVER ×2 IMPLANT
SOL .9 NS 3000ML IRR  AL (IV SOLUTION) ×2
SOL .9 NS 3000ML IRR UROMATIC (IV SOLUTION) ×1 IMPLANT
SPONGE DRAIN TRACH 4X4 STRL 2S (GAUZE/BANDAGES/DRESSINGS) ×3 IMPLANT
SPONGE LAP 18X18 5 PK (GAUZE/BANDAGES/DRESSINGS) ×3 IMPLANT
STAPLER SKIN PROX 35W (STAPLE) ×3 IMPLANT
STEM SUMMIT CEMENT BASIC SZ4 (Hips) IMPLANT
SUT ETHIBOND #5 BRAIDED 30INL (SUTURE) IMPLANT
SUT VIC AB 0 CT1 36 (SUTURE) IMPLANT
SUT VIC AB 1 CT1 36 (SUTURE) IMPLANT
SUT VIC AB 2-0 CT1 27 (SUTURE)
SUT VIC AB 2-0 CT1 TAPERPNT 27 (SUTURE) IMPLANT
SYR 20CC LL (SYRINGE) ×6 IMPLANT
SYR TB 1ML LUER SLIP (SYRINGE) ×3 IMPLANT
TAPE MICROFOAM 4IN (TAPE) IMPLANT
TAPE TRANSPORE STRL 2 31045 (GAUZE/BANDAGES/DRESSINGS) ×6 IMPLANT
TIP COAXIAL FEMORAL CANAL (MISCELLANEOUS) ×6 IMPLANT
TOWER CARTRIDGE SMART MIX (DISPOSABLE) ×6 IMPLANT
WATER STERILE IRR 1000ML POUR (IV SOLUTION) ×3 IMPLANT

## 2014-11-22 NOTE — H&P (Signed)
[facility] - [place] at [facility]   PATIENT NAME: Rebecca Reed    MR#:  [id number]  DATE OF BIRTH:  08/04/1931  DATE OF ADMISSION:  11/22/2014  PRIMARY CARE PHYSICIAN: Leim Fabry, MD   REQUESTING/REFERRING PHYSICIAN: Dr. Jene Every  CHIEF COMPLAINT:   Chief Complaint  Patient presents with  . Fall   status post mechanical fall with right hip fracture. Also noted to have a right humeral fracture.  HISTORY OF PRESENT ILLNESS:  Rebecca Reed  is a 81 y.o. female with a known history of hypertension, cognitive decline, thrombus cytopenia, hyperlipidemia, back pain, history of vertebral fracture, who presented to the hospital after mechanical fall and noted to have a right humeral and also right hip fracture. Patient apparently was attempting to get out of bed to get to her walker when she lost her balance and fell to the floor. Patient denies any prodromal symptoms prior to her fall like any chest pain, shortness of breath, palpitations, seizures, syncope or any other associated symptoms. Hospitalist services were contacted for further treatment and evaluation.  PAST MEDICAL HISTORY:   Past Medical History  Diagnosis Date  . HTN (hypertension)   . Thrombocytopenia   . Memory loss   . Hyperlipidemia   . Low back pain     Chronic  . T11 vertebral fracture     01/14/2014  . Arthritis     PAST SURGICAL HISTORY:   Past Surgical History  Procedure Laterality Date  . Total knee arthroplasty      bil    . Cholecystectomy    . Abdominal hysterectomy    . Kyphoplasty N/A 03/19/2014    Procedure: Thoracic twelve KYPHOPLASTY;  Surgeon: Maeola Harman, MD;  Location: [facility];  Service: Neurosurgery;  Laterality: N/A;    SOCIAL HISTORY:   History  Substance Use Topics  . Smoking status: Never Smoker   . Smokeless tobacco: Not on file  . Alcohol Use: No    FAMILY HISTORY:   Family History  Problem Relation Age of Onset  .  Alzheimer's disease Mother   . Alzheimer's disease Sister     DRUG ALLERGIES:   Allergies  Allergen Reactions  . Other Anaphylaxis    Uncoded Allergy. Allergen: THALLIUM    REVIEW OF SYSTEMS:   Review of Systems  Constitutional: Negative for fever and weight loss.  HENT: Negative for congestion, nosebleeds and tinnitus.   Eyes: Negative for blurred vision, double vision and redness.  Respiratory: Negative for cough, hemoptysis and shortness of breath.   Cardiovascular: Negative for chest pain, orthopnea, leg swelling and PND.  Gastrointestinal: Negative for nausea, vomiting, abdominal pain, diarrhea and melena.  Genitourinary: Negative for dysuria, urgency and hematuria.  Musculoskeletal: Positive for joint pain (right shoulder/right hip pain. ) and falls.  Skin: Negative for rash.  Neurological: Negative for dizziness, tingling, sensory change, focal weakness, seizures, weakness and headaches.  Endo/Heme/Allergies: Negative for polydipsia. Does not bruise/bleed easily.  Psychiatric/Behavioral: Positive for memory loss (early cognitive decline). Negative for depression. The patient is not nervous/anxious.     MEDICATIONS AT HOME:   Prior to Admission medications   Medication Sig Start Date End Date Taking? Authorizing Provider  acetaminophen (TYLENOL) 500 MG tablet Take 1,500 mg by mouth every 6 (six) hours as needed. For pain. Take 2 tablets ( ) orally in the morning and 1 tablet orally in the evening (along with tylenol pm at night)   Yes Historical Provider, MD  amLODipine (NORVASC) 10 MG  tablet Take 10 mg by mouth daily.   Yes Historical Provider, MD  aspirin 325 MG tablet Take 325 mg by mouth daily.   Yes Historical Provider, MD  Calcium Carb-Cholecalciferol (CALCIUM 600 + D) 600-200 MG-UNIT TABS Take 1 tablet by mouth every morning.    Yes Historical Provider, MD  carboxymethylcellulose (REFRESH PLUS) 0.5 % SOLN Apply 1-2 drops to eye every morning.   Yes Historical  Provider, MD  diphenhydramine-acetaminophen (TYLENOL PM) 25-500 MG TABS Take 1 tablet by mouth at bedtime.   Yes Historical Provider, MD  docusate sodium (COLACE) 100 MG capsule Take 100 mg by mouth every morning.   Yes Historical Provider, MD  donepezil (ARICEPT) 10 MG tablet Take 10 mg by mouth at bedtime.   Yes Historical Provider, MD  lisinopril (PRINIVIL,ZESTRIL) 20 MG tablet Take 20 mg by mouth daily.   Yes Historical Provider, MD  loratadine (SM LORATADINE) 5 MG/5ML syrup Take 5 mLs by mouth daily as needed. For allergies.   Yes Historical Provider, MD  Multiple Vitamin (MULTIVITAMIN WITH MINERALS) TABS tablet Take 1 tablet by mouth daily.   Yes Historical Provider, MD  oxyCODONE (OXY IR/ROXICODONE) 5 MG immediate release tablet Take 2.5 mg by mouth every 4 (four) hours as needed. For pain. 07/01/14  Yes Historical Provider, MD  simvastatin (ZOCOR) 10 MG tablet Take 10 mg by mouth daily.    Yes Historical Provider, MD  tolterodine (DETROL) 1 MG tablet Take 1 mg by mouth 2 (two) times daily. 09/29/14 09/29/15 Yes Historical Provider, MD  oxyCODONE (OXY IR/ROXICODONE) 5 MG immediate release tablet Take 1 tablet (5 mg total) by mouth every 4 (four) hours as needed for severe pain. 03/29/14   Maeola Harman, MD      VITAL SIGNS:  Blood pressure 171/138, pulse 77, temperature 97.5 F (36.4 C), temperature source Oral, resp. rate 30, height 5\' 3"  (1.6 m), weight 64.411 kg (142 lb), SpO2 100 %.  PHYSICAL EXAMINATION:  Physical Exam  GENERAL:  81 y.o.-year-old patient lying in the bed with no acute distress.  EYES: Pupils equal, round, reactive to light and accommodation. No scleral icterus. Extraocular muscles intact.  HEENT: Head atraumatic, normocephalic. Oropharynx and nasopharynx clear. No oropharyngeal erythema, moist oral mucosa  NECK:  Supple, no jugular venous distention. No thyroid enlargement, no tenderness.  LUNGS: Normal breath sounds bilaterally, no wheezing, rales, rhonchi. No use of  accessory muscles of respiration.  CARDIOVASCULAR: S1, S2 RRR. 2/6 systolic ejection murmur at the right sternal border, no rubs, clicks, gallops. ABDOMEN: Soft, nontender, nondistended. Bowel sounds present. No organomegaly or mass.  EXTREMITIES: No pedal edema, cyanosis, or clubbing. + 2 pedal & radial pulses b/l.  Right lower extremity is shortened and externally rotated to the hip fracture. NEUROLOGIC: Cranial nerves II through XII are intact. No focal Motor or sensory deficits appreciated b/l. Globally weak  PSYCHIATRIC: The patient is alert and oriented x 3. Good affect.  SKIN: No obvious rash, lesion, or ulcer.   LABORATORY PANEL:   CBC  Recent Labs Lab 11/22/14 0821  WBC 5.0  HGB 14.9  HCT 44.1  PLT 144*   ------------------------------------------------------------------------------------------------------------------  Chemistries   Recent Labs Lab 11/22/14 0821  NA 141  K 3.2*  CL 101  CO2 25  GLUCOSE 121*  BUN 26*  CREATININE 1.00  CALCIUM 9.9  AST 32  ALT 21  ALKPHOS 50  BILITOT 0.9   ------------------------------------------------------------------------------------------------------------------  Cardiac Enzymes  Recent Labs Lab 11/22/14 0821  TROPONINI <0.03   ------------------------------------------------------------------------------------------------------------------  RADIOLOGY:  Dg Chest 1 View  11/22/2014   CLINICAL DATA:  Fall, right hip and shoulder pain.  EXAM: CHEST  1 VIEW  COMPARISON:  03/10/2014  FINDINGS: Heart is mildly enlarged. Low lung volumes. No confluent opacities or effusions. No pneumothorax.  There is a comminuted, mildly displaced right humeral neck fracture.  IMPRESSION: Mild cardiomegaly and low lung volumes. No acute cardiopulmonary disease.  Comminuted, mildly displaced right humeral neck fracture.   Electronically Signed   By: Charlett NoseKevin  Dover M.D.   On: 11/22/2014 09:23   Dg Shoulder Right  11/22/2014   CLINICAL DATA:   Fall, right shoulder pain.  EXAM: RIGHT SHOULDER - 2+ VIEW  COMPARISON:  None.  FINDINGS: There is a comminuted right humeral neck fracture. Fracture fragments along the greater tuberosity of mildly displaced. The humeral neck is displaced medially slightly relative to the humeral head. Mild impaction. No subluxation or dislocation.  IMPRESSION: Mildly displaced, comminuted and impacted right humeral neck fracture.   Electronically Signed   By: Charlett NoseKevin  Dover M.D.   On: 11/22/2014 09:24   Ct Head Wo Contrast  11/22/2014   CLINICAL DATA:  Larey SeatFell.  Hit head.  Head and neck pain.  EXAM: CT HEAD WITHOUT CONTRAST  CT CERVICAL SPINE WITHOUT CONTRAST  TECHNIQUE: Multidetector CT imaging of the head and cervical spine was performed following the standard protocol without intravenous contrast. Multiplanar CT image reconstructions of the cervical spine were also generated.  COMPARISON:  01/14/2014  FINDINGS: CT HEAD FINDINGS  Mild stable age advanced cerebral atrophy, ventriculomegaly and periventricular white matter disease. No acute intracranial findings. No mass lesion. The brainstem and cerebellum appear normal.  No acute skull fracture. The paranasal sinuses and mastoid air cells are clear. The globes are intact.  CT CERVICAL SPINE FINDINGS  Advanced degenerative cervical spondylosis with multilevel disc disease and facet disease. The vertebral bodies are normally aligned. No acute fracture or abnormal prevertebral soft tissue swelling. The facets are normally aligned. No facet or laminar fractures.  The skullbase C1 and C1-2 articulations are maintained. The dens is intact. The lung apices are clear.  IMPRESSION: 1. No acute intracranial findings or skull fracture. 2. Advanced degenerative cervical spondylosis but no acute cervical spine fracture.   Electronically Signed   By: Rudie MeyerP.  Gallerani M.D.   On: 11/22/2014 09:08   Ct Cervical Spine Wo Contrast  11/22/2014   CLINICAL DATA:  Larey SeatFell.  Hit head.  Head and neck pain.   EXAM: CT HEAD WITHOUT CONTRAST  CT CERVICAL SPINE WITHOUT CONTRAST  TECHNIQUE: Multidetector CT imaging of the head and cervical spine was performed following the standard protocol without intravenous contrast. Multiplanar CT image reconstructions of the cervical spine were also generated.  COMPARISON:  01/14/2014  FINDINGS: CT HEAD FINDINGS  Mild stable age advanced cerebral atrophy, ventriculomegaly and periventricular white matter disease. No acute intracranial findings. No mass lesion. The brainstem and cerebellum appear normal.  No acute skull fracture. The paranasal sinuses and mastoid air cells are clear. The globes are intact.  CT CERVICAL SPINE FINDINGS  Advanced degenerative cervical spondylosis with multilevel disc disease and facet disease. The vertebral bodies are normally aligned. No acute fracture or abnormal prevertebral soft tissue swelling. The facets are normally aligned. No facet or laminar fractures.  The skullbase C1 and C1-2 articulations are maintained. The dens is intact. The lung apices are clear.  IMPRESSION: 1. No acute intracranial findings or skull fracture. 2. Advanced degenerative cervical spondylosis but no acute cervical spine fracture.  Electronically Signed   By: Rudie Meyer M.D.   On: 11/22/2014 09:08   Dg Hip Unilat With Pelvis 2-3 Views Right  11/22/2014   CLINICAL DATA:  81 year old who fell and injured the right hip.  EXAM: RIGHT HIP (WITH PELVIS) 2-3 VIEWS  COMPARISON:  None.  FINDINGS: Comminuted basicervical right femoral neck fracture. Hip joint anatomically aligned with mild to moderate medial joint space narrowing. Osseous demineralization.  Included AP pelvis demonstrates no fractures elsewhere. Sacroiliac joints and symphysis pubis intact with degenerative changes. Symmetric mild to moderate medial joint space narrowing involving the contralateral left hip. Degenerative changes involving the lower lumbar spine with prior augmentation of L4.  IMPRESSION: Acute  traumatic comminuted basicervical right femoral neck fracture.   Electronically Signed   By: Hulan Saas M.D.   On: 11/22/2014 09:25     IMPRESSION AND PLAN:   81 year old female with past medical history of mild dementia, early cognitive decline, hypertension, hyperlipidemia, history of vertebral fracture who presented to the hospital after a mechanical fall and noted to have a right humeral and right hip fracture.  #1 preoperative medical evaluation-patient is low risk for noncardiac surgery. -There is no absolute contraindications to surgery at this time. -EKG has been reviewed and shows a chronic left bundle branch block. Patient has no previous history of MI or CVA. -Patient had a nuclear medicine stress test done in January 2016 which was essentially normal.  #2 right hip/right shoulder fracture-orthopedics has been consultated and continue care as per them. -Continue pain control with when necessary morphine.  #3 hypertension-hemodynamically stable. Continue Norvasc, lisinopril.  #4 hyperlipidemia-continue simvastatin.  #5 history of urinary incontinence-continue Ditropan.  #6 history of dementia-continue Aricept   All the records are reviewed and case discussed with ED provider. Management plans discussed with the patient, family and they are in agreement.  CODE STATUS: Full   TOTAL TIME TAKING CARE OF THIS PATIENT: 50  minutes.    Houston Siren M.D on 11/22/2014 at 10:32 AM  Between 7am to 6pm - Pager - [phone]  After 6pm go to [email or web address] - password [email or web address] [facility]  [facility]  Office  [phone]  CC: Primary care physician; Leim Fabry, MD

## 2014-11-22 NOTE — ED Notes (Signed)
Attempted to call report to jadie 1a. She will call back in 5 minutes

## 2014-11-22 NOTE — Anesthesia Preprocedure Evaluation (Signed)
Anesthesia Evaluation  Patient identified by MRN, date of birth, ID band Patient awake and Patient confused    Reviewed: Allergy & Precautions, NPO status , Patient's Chart, lab work & pertinent test results  History of Anesthesia Complications Negative for: history of anesthetic complications  Airway Mallampati: III  TM Distance: >3 FB     Dental  (+) Upper Dentures, Lower Dentures   Pulmonary          Cardiovascular hypertension, Pt. on medications     Neuro/Psych    GI/Hepatic   Endo/Other    Renal/GU      Musculoskeletal  (+) Arthritis -, Osteoarthritis,    Abdominal   Peds  Hematology  (+) Blood dyscrasia (hx of thrombocytopenia, now normal plt count), ,   Anesthesia Other Findings   Reproductive/Obstetrics                             Anesthesia Physical Anesthesia Plan  ASA: III and emergent  Anesthesia Plan: Spinal   Post-op Pain Management:    Induction:   Airway Management Planned: Nasal Cannula  Additional Equipment:   Intra-op Plan:   Post-operative Plan:   Informed Consent: I have reviewed the patients History and Physical, chart, labs and discussed the procedure including the risks, benefits and alternatives for the proposed anesthesia with the patient or authorized representative who has indicated his/her understanding and acceptance.     Plan Discussed with:   Anesthesia Plan Comments:         Anesthesia Quick Evaluation

## 2014-11-22 NOTE — Transfer of Care (Signed)
Immediate Anesthesia Transfer of Care Note  Patient: Rebecca Reed  Procedure(s) Performed: Procedure(s): ARTHROPLASTY BIPOLAR HIP (HEMIARTHROPLASTY) (Right)  Patient Location: PACU  Anesthesia Type:Spinal  Level of Consciousness: awake, alert  and confused  Airway & Oxygen Therapy: Patient Spontanous Breathing and Patient connected to nasal cannula oxygen  Post-op Assessment: Report given to RN and Post -op Vital signs reviewed and stable  Post vital signs: Reviewed and stable  Last Vitals:  Filed Vitals:   11/22/14 2329  BP: 158/90  Pulse: 80  Temp: 36.2 C  Resp: 11    Complications: No apparent anesthesia complications

## 2014-11-22 NOTE — OR Nursing (Signed)
The stem implant was loose and slipped out of the cement. It was replaced successfully.

## 2014-11-22 NOTE — ED Notes (Signed)
Ems from home s/p fall from standing onto carpet. Fell on right side. C/o right shoulder pain, and right hip pain.

## 2014-11-22 NOTE — Progress Notes (Addendum)
Patient has right humeral fx which is in sling. No plans for surgery for arm at this point. Patient has right hip fx and left for surgery at 6:50pm. [facility].

## 2014-11-22 NOTE — Anesthesia Procedure Notes (Signed)
Spinal Patient location during procedure: OR Start time: 11/22/2014 7:15 PM End time: 11/22/2014 7:28 PM Staffing Performed by: anesthesiologist  Preanesthetic Checklist Completed: patient identified, site marked, surgical consent, pre-op evaluation, timeout performed, IV checked, risks and benefits discussed and monitors and equipment checked Spinal Block Patient position: sitting Prep: Betadine Patient monitoring: heart rate, continuous pulse ox, blood pressure and cardiac monitor Approach: midline Location: L4-5 Injection technique: single-shot Needle Needle type: Whitacre and Introducer  Needle gauge: 25 G Needle length: 9 cm Additional Notes Negative paresthesia. Negative blood return. Positive free-flowing CSF. Expiration date of kit checked and confirmed. Patient tolerated procedure well, without complications.

## 2014-11-22 NOTE — Op Note (Signed)
11:45PM  PATIENT:  Rebecca Reed  81 y.o. female  PRE-OPERATIVE DIAGNOSIS:  Closed displaced right femoral neck fracture       POST-OPERATIVE DIAGNOSIS:  same  PROCEDURE:  Cemented right hip hemiarthroplasty  SURGEON: Annamary Rummage, M.D.  PHYSICIAN ASSISTANT: None  ASSISTANTS: none   ANESTHESIA:   regional and IV sedation  EBL:  350cc  IMPLANTS: Depuy [place] Basic femoral stem 3, 44 mm endoprosthetic head, 0mm neck adaptor, size 3   cement restrictor, Depuy cement with gent  BLOOD ADMINISTERED:none  DRAINS: x 1 hemovac  LOCAL MEDICATIONS USED:  BUPIVICAINE   SPECIMEN:  No Specimen  DISPOSITION OF SPECIMEN:  N/A  COUNTS:  YES And correct  COMPLICATIONS: initial femoral stem loosened while cement was hardening; Stem removed and cement in cement technique utilized with excellent stem  And hip stability  TOURNIQUET:  None     DICTATION: After informed consent had been obtained, after the correct patient had been identified and after the correct surgical site has been signed by myself patient was taken to the operating room and placed in the supine position. IV antibiotics were given before a skin incision had been made and the transexemic acid (TXA) was given at the start of the procedure and was repeated at the time of wound closure. Spinal anesthesia was administered, the patient was rolled into the left lateral decubitus position and all bony prominences are padded. The patient was secured into the pegboard and an axillary roll was placed. Ted and sequential compression device were placed on the nonoperative left leg, the right shoulder fracture was managed using a sling and a pillow for support; the right hip was prepped and draped in the usual sterile fashion, hooded gowns were used and timeout was performed confirming the correct procedure procedural site and patient.. A posterolateral incision was made on the hip, incision was deepened to the iliotibial band and  gluteus maximus fascia which were incised in line with the skin incision soft tissues were mobilized and the Charnley self-retaining retractor was placed. The subperiosteal dissection was carried out of the short external rotators which was extended proximally into the hip capsule and a posterior superior trailing limb was made just distal to the gluteus minimus musculature including the piriformis tendon. The labrum was maintained. Tagging sutures were placed in the capsule and the hip was rotated to 90. The femoral neck osteotomy was performed at approximately 1 cm proximal to the lesser trochanter. The bone fragment was removed a retractor was placed in the anterior aspect of the acetabulum and the femoral head remnant was excised marked comminution of the fracture site was noted. The head was sized and sized best to a 44 mm endoprosthetic head which is confirmed with the trial. The lower extremity was rotated to 90 and the box osteotome and canal finding rasp were then used. Successive broaching was carried out in 1 size increments to a size 4 broach which showed excellent stability and leg lengths and excellent soft tissue tension. The trial broach was removed the cement restrictor was sounded and sounded best to a size 3 cement restrictor. The canal was copiously irrigated and the cement restrictor was placed, the canal was again irrigated and suctioned tip catheter was placed into the femoral canal and the canal was packed with bacitracin-soaked gauze. The packing was removed and the cement was placed in a retrograde fashion into the femoral canal and the femoral stem was placed in the canal at approximately 25 of  anteversion. Given the soft tissue tightness, retractor placement was difficult during the hardening of the cement and the femoral stem loosened as the cement was hardening; this was immediately noted and the stem was removed and the cement mantle was firmly attached to the bone and a cement in  cement revision technique was then used. A high-speed bur was then used to bur the inner surface of the cement mantle to afford better cement fixation. This cemented canal was then again copiously irrigated and a suction catheter was placed and the canal was again packed and dried and the cement was then reinserted into the femur in a retrograde fashion and pressurized after the catheter had been removed. I downsized the femoral stem 1 size in order to accommodate a better cement mantle and a size 3 stem was placed with excellent stability of the stem the size 0 neck with 44 mm endoprosthetic head was malleted into place and excellent stability of the hip was noted with greater than 60 internal rotation with the hip at neutral abduction/adduction and at 90 flexion the stem was manually tested to ensure that there was no stem instability and this was approved. Betadine solution dilute was then placed into the wound and allowed to sit in the wound for 3 minutes and then removed. the wound was copiously irrigated and hemostasis was obtained. The capsular stitches were removed and drill holes were placed in the posterolateral aspect of the greater trochanter and #2 Ethibond was then used to perform a posterior lateral capsule repair with a suture running also between the proximal aspect of the capsule and the posterior aspect of the abductors as well. A drain was placed and the deep fascia was closed initially with #1 Vicryl figure-of-eight sutures centrally in the fascia and then reinforced with #2 Quill. The skin was then closed in layers with #1 Vicryl over a 3-0 Monocryl and 3-0 nylon. 30 cc of 0.25% bupivacaine with epinephrine was injected proximally for a field block. An Aquasol and drain dressing were placed, the patient was rolled into the supine position with 2+ dorsalis pedis and posterior tibialis pulses with equal leg lengths and a warm foot. The patient was taken to the recovery room in satisfactory  condition without apparent intraoperative or anesthetic complications.  Postoperative plan: The patient will be weightbearing as tolerated with posterior hip precautions, we will proceed with Lovenox or Coumadin for DVT prophylaxis, and the patient will need surgical management of the right proximal humerus fracture as well with either open reduction internal fixation or more likely a reverse total shoulder arthroplasty-these findings were related to the patient's husband as well as the need for a cement and cement revision during the procedure;  Annamary RummageJohn Pape Parson, MD Orthopedic service  PLAN OF CARE: Admit to inpatient   PATIENT DISPOSITION:  PACU - hemodynamically stable.   Delay start of Pharmacological VTE agent (>24hrs) due to surgical blood loss or risk of bleeding: no

## 2014-11-22 NOTE — Consult Note (Signed)
Cc: Right hip and right shoulder pain  HPI: 81yo right hand dominant female with known history of dementia presents with her husband today after a fall; she lost her balance while walking across the living room; she was taken to the emergency department where she was diagnosed with a right femoral neck fracture and a right dominant proximal humerus fracture; she states sharp intermittent pain in the right shoulder and dull achy intermittent pain in the right hip only with movement; I was asked to provide definitive consultation for her right shoulder and right hip injuries, states her husband is present for today's history taking and helps to assist the patient given that she is somewhat confused during the history taking   Past Medical History  Diagnosis Date  . HTN (hypertension)   . Thrombocytopenia   . Memory loss   . Hyperlipidemia   . Low back pain     Chronic  . T11 vertebral fracture     01/14/2014  . Arthritis     PAST SURGICAL HISTORY:   Past Surgical History  Procedure Laterality Date  . Total knee arthroplasty      bil   . Cholecystectomy    . Abdominal hysterectomy    . Kyphoplasty N/A 03/19/2014    Procedure: Thoracic twelve KYPHOPLASTY; Surgeon: Maeola Harman, MD; Location: [facility]; Service: Neurosurgery; Laterality: N/A;    SOCIAL HISTORY:   History  Substance Use Topics  . Smoking status: Never Smoker   . Smokeless tobacco: Not on file  . Alcohol Use: No    FAMILY HISTORY:   Family History  Problem Relation Age of Onset  . Alzheimer's disease Mother   . Alzheimer's disease Sister     DRUG ALLERGIES:   Allergies  Allergen Reactions  . Other Anaphylaxis    Uncoded Allergy. Allergen: THALLIUM    REVIEW OF SYSTEMS:   Review of Systems  Constitutional: Negative for fever and weight loss.  HENT: Negative for congestion, nosebleeds  and tinnitus.  Eyes: Negative for blurred vision, double vision and redness.  Respiratory: Negative for cough, hemoptysis and shortness of breath.  Cardiovascular: Negative for chest pain, orthopnea, leg swelling and PND.  Gastrointestinal: Negative for nausea, vomiting, abdominal pain, diarrhea and melena.  Genitourinary: Negative for dysuria, urgency and hematuria.  Musculoskeletal: Positive for joint pain (right shoulder/right hip pain. ) and falls.  Skin: Negative for rash.  Neurological: Negative for dizziness, tingling, sensory change, focal weakness, seizures, weakness and headaches.  Endo/Heme/Allergies: Negative for polydipsia. Does not bruise/bleed easily.  Psychiatric/Behavioral: Positive for memory loss (early cognitive decline). Negative for depression. The patient is not nervous/anxious.          Physical exam: Gen: Pleasant alert elderly female nondistressed  Psychiatric: Normal affect and cooperative with examination, mood normal, Mildly confused  Vital signs listed on chart HEENT exam: Normocephalic atraumatic sclerae are clear, oral mucosal membranes are slightly dry Skin: Skin over the right hip is intact; skin is warm and intact over the right shoulder region as well,; Lymphatic:Mild  swelling right posterolateral hipAnd moderate swelling over the right lateral shoulder and anterior shoulder  Lungs:Clear to auscultation bilaterally; overall good air exchange Heart exam: Regular rate,regular rhythm,no murmurs or gallops  Vascular exam: 2+ dorsalis pedis and posterior tibialis pulse lower extremity rightAnd 2+ radial pulse right upper extremity  Neurologic exam: Intact right lower extremity at the right  foot; right upper extremity neurologic exam intact including axillary nerve function motor and light touch sensation   Musculoskeletal  exam:Left upper extremity  and left lower extremity nontender to palpation and without deformity; right lower extremity exam:  Extremity is shortened and externally rotated; nontender to palpation except at the anterolateral aspect of the right hip and significant pain with log roll maneuver, skin is warm and intact over the hip, Right upper extremity exam: No deformity, moderate tenderness to palpation over the proximal humerus, remainder of the ipsilateral left upper extremity is nontender to palpation and without deformity  X-ray evaluation: Displaced right femoral neck fracture with significant osteopenia, overall well preserved right hip joint, CT examination consistent with plain radiographs with CT not showing extension of the femoral neck fracture into the intertrochanteric or subtrochanteric region Right shoulder radiographs demonstrate a comminuted proximal humerus fracture 3 part displaced with significant comminution of the humeral neck and comminution of the greater tuberosity as well; CT of the right shoulder demonstrates a comminuted 4 part proximal humerus fracture with marked displacement and marked comminution, there are also moderate to marked degenerative changes especially on the humeral head side with subchondral sclerosis centrally and superiorly as well as subchondral cysts  Impression: Closed displaced right femoral neck fracture Closed displaced 4 part right proximal humerus fracture complex  Plan: Cemented right hip hemiarthroplasty Admission by the hospitalists service-patient has been deemed to be medically optimized for surgical intervention today Teds and sequential compression device bilateral lower extremities We will delay surgical management of the right proximal humerus fracture given that I do not perform shoulder replacement surgery and that is certainly not necessarily for today's intervention-the patient most likely will require either open reduction internal fixation of the right proximal humerus fracture or a reverse total shoulder arthroplasty Sling for right shoulder with ice Risks  benefits and alternatives discussed with the husband today (who has medical power of attorney) including but not limited to bleeding infection damage to blood vessels and nerves need for further surgery and treatment chronic pain loss of function stiffness medical complications including heart long brain and kidney complications, death, DVT/PE, and specific hip risks were also discussed including periprosthetic fracture and calcar fracture, periprosthetic instability, and leg length inequality,; he appeared to understand the risks and benefits and desired to proceed with surgical treatment for his wife, he requests possible surgical management of the shoulder injury with the [facility] orthopedic surgery service, we'll proceed with surgery tonight for the right hip

## 2014-11-22 NOTE — ED Provider Notes (Signed)
[facility] Emergency Department Provider Note  ____________________________________________  Time seen: On arrival, via EMS  I have reviewed the triage vital signs and the nursing notes.   HISTORY  Chief Complaint Fall  H&P limited secondary to severe anxiety  HPI Rebecca Reed is a 81 y.o. female who presents after a fall. Reportedly patient fell onto her right side. She complained of neck pain to EMS. She complains also of right shoulder pain and right hip pain. She denies abdominal pain. She she denies neuro deficits. She reports she fell because she tripped on a carpet. She denies dizziness.     Past Medical History  Diagnosis Date  . HTN (hypertension)   . Thrombocytopenia   . Memory loss   . Hyperlipidemia   . Low back pain     Chronic  . T11 vertebral fracture     01/14/2014  . Arthritis     Patient Active Problem List   Diagnosis Date Noted  . Compression fracture 03/25/2014  . Compression fracture of body of thoracic vertebra 03/20/2014  . Thoracic compression fracture 03/19/2014  . Essential hypertension, benign 01/15/2014  . Hypokalemia 01/15/2014  . Closed fracture of thoracic vertebra without spinal cord injury 01/14/2014    Past Surgical History  Procedure Laterality Date  . Total knee arthroplasty      bil    . Cholecystectomy    . Abdominal hysterectomy    . Kyphoplasty N/A 03/19/2014    Procedure: Thoracic twelve KYPHOPLASTY;  Surgeon: Maeola HarmanJoseph Stern, MD;  Location: [facility];  Service: Neurosurgery;  Laterality: N/A;    Current Outpatient Rx  Name  Route  Sig  Dispense  Refill  . acetaminophen (TYLENOL) 325 MG tablet   Oral   Take 650 mg by mouth every 6 (six) hours as needed for mild pain.         Marland Kitchen. amLODipine (NORVASC) 10 MG tablet   Oral   Take 10 mg by mouth daily.         Marland Kitchen. aspirin EC 81 MG tablet   Oral   Take 81 mg by mouth daily.         . Calcium Carb-Cholecalciferol (CALCIUM 600 + D)  600-200 MG-UNIT TABS   Oral   Take 1 tablet by mouth daily.         Marland Kitchen. donepezil (ARICEPT) 5 MG tablet   Oral   Take 5 mg by mouth at bedtime.         Marland Kitchen. lisinopril (PRINIVIL,ZESTRIL) 20 MG tablet   Oral   Take 20 mg by mouth daily.         . Multiple Vitamin (MULTIVITAMIN WITH MINERALS) TABS tablet   Oral   Take 1 tablet by mouth daily.         Marland Kitchen. oxyCODONE (OXY IR/ROXICODONE) 5 MG immediate release tablet   Oral   Take 1 tablet (5 mg total) by mouth every 4 (four) hours as needed for severe pain.   30 tablet   0   . simvastatin (ZOCOR) 10 MG tablet   Oral   Take 10 mg by mouth every morning.           Allergies Other  No family history on file.  Social History History  Substance Use Topics  . Smoking status: Former Games developermoker  . Smokeless tobacco: Not on file  . Alcohol Use: No    Review of Systems  Constitutional: Negative for fever. ENT: No change in vision  Cardiovascular: Negative for chest pain. Respiratory: Negative for shortness of breath. Gastrointestinal: Negative for abdominal pain, vomiting  Genitourinary: Negative for dysuria. Musculoskeletal: Negative for back pain. Positive for right shoulder pain, right hip pain Skin: Negative for abrasion or laceration Neurological: Negative for headaches or focal weakness Psychiatric: Positive for anxiety    ____________________________________________   PHYSICAL EXAM:  VITAL SIGNS: ED Triage Vitals  Enc Vitals Group     BP 11/22/14 0759 171/138 mmHg     Pulse Rate 11/22/14 0759 77     Resp 11/22/14 0759 30     Temp 11/22/14 0759 97.5 F (36.4 C)     Temp Source 11/22/14 0759 Oral     SpO2 11/22/14 0759 100 %     Weight 11/22/14 0759 142 lb (64.411 kg)     Height 11/22/14 0759 5\' 3"  (1.6 m)     Head Cir --      Peak Flow --      Pain Score --      Pain Loc --      Pain Edu? --      Excl. in GC? --      Constitutional: Alert and oriented. Extremely anxious and becomes frustrated  with any questions Eyes: Conjunctivae are normal.  ENT Neck: C-collar in place prior to arrival   Head: Normocephalic and atraumatic.   Mouth/Throat: Mucous membranes are moist. Cardiovascular: Normal rate, regular rhythm. Normal and symmetric distal pulses are present in all extremities. No murmurs, rubs, or gallops. Respiratory: Normal respiratory effort without tachypnea nor retractions. Breath sounds are clear and equal bilaterally.  Gastrointestinal: Soft and non-tender in all quadrants. No distention. There is no CVA tenderness. Genitourinary: deferred Musculoskeletal: Right leg is externally rotated and shortened, tenderness palpation of right hip. 2+ pulses distally. Right shoulder tender to palpation and significant pain with any movement of humerus. 2+ distal pulses. Neurologic:  Normal speech and language. No gross focal neurologic deficits are appreciated. Skin:  Skin is warm, dry and intact. No rash noted. Psychiatric: Patient is extremely anxious and difficult to obtain history from because she becomes angry with me for asking questions  ____________________________________________    LABS (pertinent positives/negatives)  Labs Reviewed  CBC WITH DIFFERENTIAL/PLATELET - Abnormal; Notable for the following:    Platelets 144 (*)    All other components within normal limits  COMPREHENSIVE METABOLIC PANEL - Abnormal; Notable for the following:    Potassium 3.2 (*)    Glucose, Bld 121 (*)    BUN 26 (*)    Total Protein 8.3 (*)    Albumin 5.3 (*)    GFR calc non Af Amer 51 (*)    GFR calc Af Amer 60 (*)    All other components within normal limits  PROTIME-INR  APTT  TROPONIN I  URINALYSIS COMPLETEWITH MICROSCOPIC ([facility] ONLY)  TYPE AND SCREEN    ____________________________________________   EKG  ED ECG REPORT I, Jene Every, the attending physician, personally viewed and interpreted this ECG.   Date: 11/22/2014  EKG Time: 8:07 AM  Rate: 74  Rhythm:  normal sinus rhythm  Axis: Normal axis  Intervals:left bundle branch block  ST&T Change: Nonspecific   ____________________________________________    RADIOLOGY I have personally reviewed any xrays that were ordered on this patient: Right humerus x-ray: Comminuted right humeral neck fracture  Right hip x-ray, femoral neck fracture  CT head and cervical spine no acute distress   ____________________________________________   PROCEDURES  Procedure(s) performed: none  Critical Care  performed: none  ____________________________________________   INITIAL IMPRESSION / ASSESSMENT AND PLAN / ED COURSE  Pertinent labs & imaging results that were available during my care of the patient were reviewed by me and considered in my medical decision making (see chart for details).  Strongly suspect right hip fracture, and likely proximal humerus fracture. We will give Ativan for anxiety in addition morphine for pain. We will obtain CT head, C-spine and x-rays of the right shoulder and right hip. ____________________________________________  ----------------------------------------- 9:48 AM on 11/22/2014 -----------------------------------------  Discussed x-ray results with Dr. Astrid Drafts of Orthopedics. He asks to make the patient NPO and obtain a ct of hip and shoulder for further characterization of fractures. Requests admission by IM. Plans for surgery today if possible. D/W patient and husband.     FINAL CLINICAL IMPRESSION(S) / ED DIAGNOSES  Final diagnoses:  Basicervical fracture of neck of femur, right, closed, initial encounter  Humerus fracture, right, closed, initial encounter     Jene Every, MD 11/22/14 [phone]

## 2014-11-23 ENCOUNTER — Inpatient Hospital Stay: Payer: Medicare Other

## 2014-11-23 ENCOUNTER — Encounter: Payer: Self-pay | Admitting: Orthopedic Surgery

## 2014-11-23 LAB — GLUCOSE, CAPILLARY: GLUCOSE-CAPILLARY: 150 mg/dL — AB (ref 65–99)

## 2014-11-23 LAB — CBC
HEMATOCRIT: 32.7 % — AB (ref 35.0–47.0)
Hemoglobin: 11.3 g/dL — ABNORMAL LOW (ref 12.0–16.0)
MCH: 30.7 pg (ref 26.0–34.0)
MCHC: 34.5 g/dL (ref 32.0–36.0)
MCV: 88.9 fL (ref 80.0–100.0)
Platelets: 112 10*3/uL — ABNORMAL LOW (ref 150–440)
RBC: 3.68 MIL/uL — ABNORMAL LOW (ref 3.80–5.20)
RDW: 13.8 % (ref 11.5–14.5)
WBC: 6.9 10*3/uL (ref 3.6–11.0)

## 2014-11-23 LAB — BASIC METABOLIC PANEL
Anion gap: 8 (ref 5–15)
BUN: 21 mg/dL — ABNORMAL HIGH (ref 6–20)
CALCIUM: 8.5 mg/dL — AB (ref 8.9–10.3)
CHLORIDE: 106 mmol/L (ref 101–111)
CO2: 28 mmol/L (ref 22–32)
CREATININE: 0.96 mg/dL (ref 0.44–1.00)
GFR calc Af Amer: >60 mL/min (ref >60)
GFR calc non Af Amer: 54 mL/min — ABNORMAL LOW (ref >60)
Glucose, Bld: 148 mg/dL — ABNORMAL HIGH (ref 65–99)
POTASSIUM: 3.3 mmol/L — AB (ref 3.5–5.1)
Sodium: 142 mmol/L (ref 135–145)

## 2014-11-23 MED ORDER — CEFAZOLIN SODIUM 1-5 GM-% IV SOLN
1.0000 g | Freq: Three times a day (TID) | INTRAVENOUS | Status: DC
  Administered 2014-11-23 (×2): 1 g via INTRAVENOUS
  Filled 2014-11-23 (×3): qty 50

## 2014-11-23 MED ORDER — ENOXAPARIN SODIUM 40 MG/0.4ML ~~LOC~~ SOLN
40.0000 mg | SUBCUTANEOUS | Status: DC
  Administered 2014-11-23: 40 mg via SUBCUTANEOUS
  Filled 2014-11-23: qty 0.4

## 2014-11-23 MED ORDER — POTASSIUM CHLORIDE CRYS ER 20 MEQ PO TBCR
20.0000 meq | EXTENDED_RELEASE_TABLET | Freq: Two times a day (BID) | ORAL | Status: DC
  Administered 2014-11-23 – 2014-11-25 (×5): 20 meq via ORAL
  Filled 2014-11-23 (×5): qty 1

## 2014-11-23 MED ORDER — HYDROCODONE-ACETAMINOPHEN 5-325 MG PO TABS
1.0000 | ORAL_TABLET | ORAL | Status: DC | PRN
  Administered 2014-11-23: 1 via ORAL
  Filled 2014-11-23: qty 1

## 2014-11-23 MED ORDER — ENOXAPARIN SODIUM 30 MG/0.3ML ~~LOC~~ SOLN
30.0000 mg | Freq: Two times a day (BID) | SUBCUTANEOUS | Status: DC
  Administered 2014-11-24 – 2014-11-25 (×3): 30 mg via SUBCUTANEOUS
  Filled 2014-11-23 (×3): qty 0.3

## 2014-11-23 NOTE — Progress Notes (Signed)
Physical Therapy Treatment Patient Details Name: Rebecca Reed MRN: [id number] DOB: 03-27-1932 Today's Date: 11/23/2014    History of Present Illness Rebecca Reed is a 81 y.o. female with a known history of hypertension, cognitive decline, thrombocytopenia, hyperlipidemia, back pain, history of vertebral fracture, who presented to the hospital after mechanical fall and noted to have a right humeral and also right hip fracture. Patient apparently was attempting to get out of bed to get to her walker when she lost her balance and fell to the floor. Patient denies any prodromal symptoms prior to her fall like any chest pain, shortness of breath, palpitations, seizures, syncope or any other associated symptoms. Pt underwent R hip hemiarthroplasty and is POD#1. In addition pt with R humeral fracture and she is NWB RUE. POC for RUE to be determined by orthopedist today. Pt reports history of 2 falls in the last 12 months. She lives at [facility] independent living with husband.     PT Comments    Pt demonstrates improving mobility this afternoon but still requires considerable assistance. She is significantly limited by her cognitive status resulting in difficulty recalling hip precautions or maintaining RLE PWB/RUE NWB. Pt demonstrates ability to increase ambulation distance this afternoon and reports less R hip pain with bed level exercises. Pt will benefit from skilled PT services to address deficits in strength, balance, and mobility in order to return to full function at home.    Follow Up Recommendations  SNF     Equipment Recommendations   (Hemiwalker, can be delivered by SNF)    Recommendations for Other Services       Precautions / Restrictions Precautions Precautions: Posterior Hip Precaution Booklet Issued: Yes (comment) Precaution Comments: R Required Braces or Orthoses: Sling (R shoulder sling; Abduction wedge in bed) Restrictions Weight Bearing Restrictions: Yes RUE  Weight Bearing: Non weight bearing RLE Weight Bearing: Partial weight bearing (Updated since AM chart review at 8:00AM)    Mobility  Bed Mobility Overal bed mobility: Needs Assistance;+2 for physical assistance Bed Mobility: Supine to Sit;Sit to Supine     Supine to sit: Max assist;+2 for physical assistance Sit to supine: Max assist;+2 for physical assistance   General bed mobility comments: Pt with poor strength in RLE and poor sequencing. Decreased pain noted during bed mobility this afternoon. RUE in sling and decreased attempts to utilize RUE compared to this morning. Repeated reminders to avoid violating hip precautions  Transfers Overall transfer level: Needs assistance Equipment used: 1 person hand held assist (in LUE) Transfers: Sit to/from Stand Sit to Stand: +2 physical assistance;Min assist         General transfer comment: Cues for hand placement and sequencing. Encouraged equal weight bearing through bilateral LE to maintain RLE PWB. Pt pushes through LUE with HHA. Cues to avoid excessive R hip flexion so as not to violate precautions  Ambulation/Gait Ambulation/Gait assistance: Mod assist;+2 physical assistance Ambulation Distance (Feet): 3 Feet Assistive device: 1 person hand held assist (in LUE) Gait Pattern/deviations: Step-to pattern;Decreased weight shift to right   Gait velocity interpretation: <1.8 ft/sec, indicative of risk for recurrent falls General Gait Details: Pt able to take a few small steps forward/backward and sidestepping at EOB with LUE HHA and modA+2 for PWB in LLE. Pt reminded continually of WB but unable to tell if she is truly respecting PWB status. Pt demonstrates reasonable strength in bilateral LEs this afternoon. Pt requires seated rest break and reports fatigue.    Stairs  Wheelchair Mobility    Modified Rankin (Stroke Patients Only)       Balance Overall balance assessment: Needs assistance   Sitting  balance-Leahy Scale: Fair       Standing balance-Leahy Scale: Poor                      Cognition Arousal/Alertness: Awake/alert Behavior During Therapy: WFL for tasks assessed/performed Overall Cognitive Status: History of cognitive impairments - at baseline (AOx3, slightly disoriented to situation, poor retention)       Memory: Decreased recall of precautions;Decreased short-term memory (0/3 posterior precautions recalled)              Exercises Total Joint Exercises Ankle Circles/Pumps: Strengthening;Both;15 reps;Supine Quad Sets: Strengthening;Both;Supine;15 reps Gluteal Sets: Strengthening;Both;Supine;15 reps Towel Squeeze: Strengthening;Both;Supine;15 reps Short Arc Quad: Strengthening;Both;Supine;15 reps Heel Slides: Strengthening;Both;Supine;15 reps Hip ABduction/ADduction: Strengthening;Both;Supine;15 reps Straight Leg Raises: Strengthening;Both;10 reps;Supine    General Comments        Pertinent Vitals/Pain Pain Assessment: 0-10 Pain Score: 0-No pain (Pt grimaces during bed mobility and transfers, does not rate) Pain Location: R hip, R shoulder Pain Intervention(s): Monitored during session    Home Living                      Prior Function            PT Goals (current goals can now be found in the care plan section) Acute Rehab PT Goals Patient Stated Goal: "I really want to be able to walk" PT Goal Formulation: With patient/family Time For Goal Achievement: 12/07/14 Potential to Achieve Goals: Fair Progress towards PT goals: Progressing toward goals    Frequency  BID    PT Plan Current plan remains appropriate    Co-evaluation             End of Session Equipment Utilized During Treatment: Gait belt (R shoulder sling) Activity Tolerance: Patient limited by pain Patient left: in bed;with call bell/phone within reach;with bed alarm set;with family/visitor present     Time: 1405-1430 PT Time Calculation (min)  (ACUTE ONLY): 25 min  Charges:  $Gait Training: 8-22 mins $Therapeutic Exercise: 8-22 mins                    G Codes:      Sharalyn InkJason D Huprich PT, DPT   Huprich,Jason 11/23/2014, 3:59 PM

## 2014-11-23 NOTE — Outcomes Assessment (Signed)
Patient arrived from pacu  At 0100, alert but mildly confused. Denies pain. Foley and hemovac in place. No signs of distress.

## 2014-11-23 NOTE — Plan of Care (Signed)
Problem: Consults Goal: Diagnosis- Total Joint Replacement Outcome: Progressing Revision Total Hip     

## 2014-11-23 NOTE — Clinical Social Work Placement (Signed)
   CLINICAL SOCIAL WORK PLACEMENT  NOTE  Date:  11/23/2014  Patient Details  Name: Doreen Salvagestelle W Szymanowski MRN: [id number] Date of Birth: 12/16/1931  Clinical Social Work is seeking post-discharge placement for this patient at the Skilled  Nursing Facility level of care (*CSW will initial, date and re-position this form in  chart as items are completed):  Yes   Patient/family provided with [place] Clinical Social Work Department's list of facilities offering this level of care within the geographic area requested by the patient (or if unable, by the patient's family).  Yes   Patient/family informed of their freedom to choose among providers that offer the needed level of care, that participate in Medicare, Medicaid or managed care program needed by the patient, have an available bed and are willing to accept the patient.  Yes   Patient/family informed of [facility]'s ownership interest in [facility] and [facility], as well as of the fact that they are under no obligation to receive care at these facilities.  PASRR submitted to EDS on       PASRR number received on       Existing PASRR number confirmed on 11/23/14     FL2 transmitted to all facilities in geographic area requested by pt/family on 11/23/14     FL2 transmitted to all facilities within larger geographic area on       Patient informed that his/her managed care company has contracts with or will negotiate with certain facilities, including the following:        Yes   Patient/family informed of bed offers received.  Patient chooses bed at  [facility] [facility]     Physician recommends and patient chooses bed at      Patient to be transferred to   on  .  Patient to be transferred to facility by       Patient family notified on   of transfer.  Name of family member notified:        PHYSICIAN Please sign FL2     Additional Comment:    _______________________________________________ Haig ProphetMorgan, Milagro Belmares G,  LCSW 11/23/2014, 3:24 PM

## 2014-11-23 NOTE — Progress Notes (Signed)
Subjective:  Postoperative day #1 from right hip hemiarthroplasty. Patient also has a concomitant comminuted fracture of the proximal humerus on the right side. Patient reports pain as moderate.  Patient is in the right arm sling. She complains of pain in the right shoulder. She has mild to moderate right hip pain. Her husband is at the bedside. She is sitting upright in bed.  Patient has dementia which affects her ability to provide an accurate history. History given by her husband.  Objective:   VITALS:   Filed Vitals:   11/23/14 0500 11/23/14 0639 11/23/14 0759 11/23/14 1119  BP: 166/71 171/59 123/93 151/56  Pulse: 65 67 71 75  Temp: 98.3 F (36.8 C) 98.1 F (36.7 C) 97.9 F (36.6 C) 98.3 F (36.8 C)  TempSrc: Oral Oral Oral   Resp: 18 16 18 18   Height:      Weight:      SpO2: 94% 100% 98% 99%   Right upper extremity: Patient's skin is intact. There is moderate swelling without erythema or ecchymosis. She has intact sensation throughout the right upper extremity including over the axillary nerve distribution. She has full digital and wrist range of motion with a palpable radial pulse. Patient has intact sensation to light touch in all 5 digits.  Right lower extremity: Neurovascular intact Sensation intact distally Intact pulses distally Dorsiflexion/Plantar flexion intact Incision: dressing C/D/I No cellulitis present Compartment soft  LABS  Results for orders placed or performed during the hospital encounter of 11/22/14 (from the past 24 hour(s))  Type and screen     Status: None   Collection Time: 11/22/14  1:30 PM  Result Value Ref Range   ABO/RH(D) A POS    Antibody Screen NEG    Sample Expiration 11/25/2014   ABO/Rh     Status: None   Collection Time: 11/22/14  1:31 PM  Result Value Ref Range   ABO/RH(D) A POS   Glucose, capillary     Status: Abnormal   Collection Time: 11/23/14 12:58 AM  Result Value Ref Range   Glucose-Capillary 150 (H) 65 - 99 mg/dL    Comment 1 Notify RN   Basic metabolic panel     Status: Abnormal   Collection Time: 11/23/14  5:27 AM  Result Value Ref Range   Sodium 142 135 - 145 mmol/L   Potassium 3.3 (L) 3.5 - 5.1 mmol/L   Chloride 106 101 - 111 mmol/L   CO2 28 22 - 32 mmol/L   Glucose, Bld 148 (H) 65 - 99 mg/dL   BUN 21 (H) 6 - 20 mg/dL   Creatinine, Ser 1.61 0.44 - 1.00 mg/dL   Calcium 8.5 (L) 8.9 - 10.3 mg/dL   GFR calc non Af Amer 54 (L) >60 mL/min   GFR calc Af Amer >60 >60 mL/min   Anion gap 8 5 - 15  CBC     Status: Abnormal   Collection Time: 11/23/14  5:27 AM  Result Value Ref Range   WBC 6.9 3.6 - 11.0 K/uL   RBC 3.68 (L) 3.80 - 5.20 MIL/uL   Hemoglobin 11.3 (L) 12.0 - 16.0 g/dL   HCT 09.6 (L) 04.5 - 40.9 %   MCV 88.9 80.0 - 100.0 fL   MCH 30.7 26.0 - 34.0 pg   MCHC 34.5 32.0 - 36.0 g/dL   RDW 81.1 91.4 - 78.2 %   Platelets 112 (L) 150 - 440 K/uL    Dg Chest 1 View  11/22/2014   CLINICAL DATA:  Fall, right hip and shoulder pain.  EXAM: CHEST  1 VIEW  COMPARISON:  03/10/2014  FINDINGS: Heart is mildly enlarged. Low lung volumes. No confluent opacities or effusions. No pneumothorax.  There is a comminuted, mildly displaced right humeral neck fracture.  IMPRESSION: Mild cardiomegaly and low lung volumes. No acute cardiopulmonary disease.  Comminuted, mildly displaced right humeral neck fracture.   Electronically Signed   By: Charlett Nose M.D.   On: 11/22/2014 09:23   Dg Shoulder Right  11/22/2014   CLINICAL DATA:  Fall, right shoulder pain.  EXAM: RIGHT SHOULDER - 2+ VIEW  COMPARISON:  None.  FINDINGS: There is a comminuted right humeral neck fracture. Fracture fragments along the greater tuberosity of mildly displaced. The humeral neck is displaced medially slightly relative to the humeral head. Mild impaction. No subluxation or dislocation.  IMPRESSION: Mildly displaced, comminuted and impacted right humeral neck fracture.   Electronically Signed   By: Charlett Nose M.D.   On: 11/22/2014 09:24   Ct Head  Wo Contrast  11/22/2014   CLINICAL DATA:  Larey Seat.  Hit head.  Head and neck pain.  EXAM: CT HEAD WITHOUT CONTRAST  CT CERVICAL SPINE WITHOUT CONTRAST  TECHNIQUE: Multidetector CT imaging of the head and cervical spine was performed following the standard protocol without intravenous contrast. Multiplanar CT image reconstructions of the cervical spine were also generated.  COMPARISON:  01/14/2014  FINDINGS: CT HEAD FINDINGS  Mild stable age advanced cerebral atrophy, ventriculomegaly and periventricular white matter disease. No acute intracranial findings. No mass lesion. The brainstem and cerebellum appear normal.  No acute skull fracture. The paranasal sinuses and mastoid air cells are clear. The globes are intact.  CT CERVICAL SPINE FINDINGS  Advanced degenerative cervical spondylosis with multilevel disc disease and facet disease. The vertebral bodies are normally aligned. No acute fracture or abnormal prevertebral soft tissue swelling. The facets are normally aligned. No facet or laminar fractures.  The skullbase C1 and C1-2 articulations are maintained. The dens is intact. The lung apices are clear.  IMPRESSION: 1. No acute intracranial findings or skull fracture. 2. Advanced degenerative cervical spondylosis but no acute cervical spine fracture.   Electronically Signed   By: Rudie Meyer M.D.   On: 11/22/2014 09:08   Ct Cervical Spine Wo Contrast  11/22/2014   CLINICAL DATA:  Larey Seat.  Hit head.  Head and neck pain.  EXAM: CT HEAD WITHOUT CONTRAST  CT CERVICAL SPINE WITHOUT CONTRAST  TECHNIQUE: Multidetector CT imaging of the head and cervical spine was performed following the standard protocol without intravenous contrast. Multiplanar CT image reconstructions of the cervical spine were also generated.  COMPARISON:  01/14/2014  FINDINGS: CT HEAD FINDINGS  Mild stable age advanced cerebral atrophy, ventriculomegaly and periventricular white matter disease. No acute intracranial findings. No mass lesion. The  brainstem and cerebellum appear normal.  No acute skull fracture. The paranasal sinuses and mastoid air cells are clear. The globes are intact.  CT CERVICAL SPINE FINDINGS  Advanced degenerative cervical spondylosis with multilevel disc disease and facet disease. The vertebral bodies are normally aligned. No acute fracture or abnormal prevertebral soft tissue swelling. The facets are normally aligned. No facet or laminar fractures.  The skullbase C1 and C1-2 articulations are maintained. The dens is intact. The lung apices are clear.  IMPRESSION: 1. No acute intracranial findings or skull fracture. 2. Advanced degenerative cervical spondylosis but no acute cervical spine fracture.   Electronically Signed   By: Orlene Plum.D.  On: 11/22/2014 09:08   Ct Shoulder Right Wo Contrast  11/22/2014   CLINICAL DATA:  Proximal RIGHT humerus fracture. Fall. Comminuted proximal RIGHT humerus fracture on shoulder radiographs.  EXAM: CT OF THE RIGHT SHOULDER WITHOUT CONTRAST  TECHNIQUE: Multidetector CT imaging was performed according to the standard protocol. Multiplanar CT image reconstructions were also generated.  COMPARISON:  11/22/2014.  FINDINGS: There is a 3 part fracture of the proximal RIGHT humerus. The greater tuberosity and proximal metaphysis are displaced greater than 1 cm. Most of the humeral head articular surface is intact. Subchondral sclerosis and cysts are present in the humeral head, compatible with old AVN with secondary degenerative changes. This does not appear to involve the bare area of the humerus and instead appears along the posterior humeral head articular surface.  The anterior inferior glenoid shows a small bone fragment and cortical defect compatible with an acute fracture of the glenoid. This is compatible with a tiny bony Bankart although it is unclear whether this shoulder dislocated with this injury.  The visible RIGHT chest shows subsegmental atelectasis or scarring in the RIGHT middle  lobe. No displaced rib fractures. The RIGHT clavicle is intact. Type 3 acromion. Undersurface acromial spurring with distal clavicle hypertrophy.  Mild SUPRASPINATUS muscular atrophy is present compatible with chronic partial tear. INFRASPINATUS and SUBSCAPULARIS muscle bulk is preserved.  Lipohemarthrosis is present in the subacromial/subdeltoid bursa.  IMPRESSION: Three part proximal RIGHT humerus fracture. Tiny acute bony Bankart fracture. Subchondral sclerosis and cystic change in the posterior humeral head compatible with AVN and secondary osteoarthritis. Mild SUPRASPINATUS muscular atrophy suggesting a chronic partial tear.   Electronically Signed   By: Andreas Newport M.D.   On: 11/22/2014 12:11   Ct Hip Right Wo Contrast  11/22/2014   CLINICAL DATA:  RIGHT hip fracture. Further characterization of hip fracture.  EXAM: CT OF THE RIGHT HIP WITHOUT CONTRAST  TECHNIQUE: Multidetector CT imaging of the right hip was performed according to the standard protocol. Multiplanar CT image reconstructions were also generated.  COMPARISON:  Radiographs 11/22/2014.  FINDINGS: There is an acute cervical RIGHT femoral neck fracture. No intertrochanteric extension is identified. Femoral head remains located. Pre-existing osteoarthritis of the RIGHT hip appears mild.  Displacement and impaction are mild. Approximately 8 mm superior displacement of the distal femur relative to the neck.  Fat containing RIGHT inguinal hernia is present. Foley catheter present in the urinary bladder. Gluteal muscles appear within normal limits.  IMPRESSION: Acute RIGHT cervical femoral neck fracture.   Electronically Signed   By: Andreas Newport M.D.   On: 11/22/2014 11:43   Dg Hip Unilat With Pelvis 1v Right  11/23/2014   CLINICAL DATA:  Postop right hip surgery  EXAM: RIGHT HIP (WITH PELVIS) 1 VIEW  COMPARISON:  11/22/2014  FINDINGS: Interval bipolar right hip hemiarthroplasty. The prosthesis is located and well-seated. No new osseous  abnormality seen in the pelvis or left hip.  IMPRESSION: Unremarkable new right hip hemiarthroplasty.   Electronically Signed   By: Marnee Spring M.D.   On: 11/23/2014 00:45   Dg Hip Unilat With Pelvis 2-3 Views Right  11/22/2014   CLINICAL DATA:  81 year old who fell and injured the right hip.  EXAM: RIGHT HIP (WITH PELVIS) 2-3 VIEWS  COMPARISON:  None.  FINDINGS: Comminuted basicervical right femoral neck fracture. Hip joint anatomically aligned with mild to moderate medial joint space narrowing. Osseous demineralization.  Included AP pelvis demonstrates no fractures elsewhere. Sacroiliac joints and symphysis pubis intact with degenerative changes. Symmetric  mild to moderate medial joint space narrowing involving the contralateral left hip. Degenerative changes involving the lower lumbar spine with prior augmentation of L4.  IMPRESSION: Acute traumatic comminuted basicervical right femoral neck fracture.   Electronically Signed   By: Hulan Saashomas  Lawrence M.D.   On: 11/22/2014 09:25    Assessment/Plan: 1 Day Post-Op   Active Problems:   Closed right hip fracture  Patient continues her right arm sling for her right proximal humerus fracture. I discussed with the patient and her husband trim and options which include nonoperative versus operative management. Patient has comminution of the proximal humerus and would likely be a candidate for a shoulder arthroplasty versus ORIF. Patient will be partial weightbearing on the right lower extremity and nonweightbearing on the right upper extremity. She is using a single hand walker. Patient will start Lovenox for DVT prophylaxis today. She'll complete 24 hours postop antibiotics. She will continue physical occupational therapy. Labs be rechecked in the a.m. Current incentive spirometry while awake.    Juanell FairlyKRASINSKI, Solei Wubben , MD 11/23/2014, 1:24 PM

## 2014-11-23 NOTE — Progress Notes (Signed)
[facility] - [place] at [facility]   PATIENT NAME: Rebecca Reed    MR#:  [id number]  DATE OF BIRTH:  1931/12/15  SUBJECTIVE:  CHIEF COMPLAINT:   Chief Complaint  Patient presents with  . Fall   patient is status post right hip replacement postop day #1. Family at bedside. Patient is complaining of the right shoulder pain.  REVIEW OF SYSTEMS:    Review of Systems  Constitutional: Negative for fever and chills.  HENT: Negative for congestion and tinnitus.   Eyes: Negative for blurred vision and double vision.  Respiratory: Negative for cough, shortness of breath and wheezing.   Cardiovascular: Negative for chest pain, orthopnea and PND.  Gastrointestinal: Negative for nausea, vomiting, abdominal pain and diarrhea.  Genitourinary: Negative for dysuria and hematuria.  Musculoskeletal: Positive for joint pain (right shoulder pain).  Neurological: Negative for dizziness, sensory change and focal weakness.  All other systems reviewed and are negative.  Nutrition: Clear liquid Tolerating Diet: Yes Tolerating PT: Yes    DRUG ALLERGIES:   Allergies  Allergen Reactions  . Other Anaphylaxis    Uncoded Allergy. Allergen: THALLIUM    VITALS:  Blood pressure 123/93, pulse 71, temperature 97.9 F (36.6 C), temperature source Oral, resp. rate 18, height 5\' 3"  (1.6 m), weight 64.411 kg (142 lb), SpO2 98 %.  PHYSICAL EXAMINATION:   Physical Exam  GENERAL:  81 y.o.-year-old patient lying in the bed with no acute distress.  EYES: Pupils equal, round, reactive to light and accommodation. No scleral icterus. Extraocular muscles intact.  HEENT: Head atraumatic, normocephalic. Oropharynx and nasopharynx clear.  NECK:  Supple, no jugular venous distention. No thyroid enlargement, no tenderness.  LUNGS: Normal breath sounds bilaterally, no wheezing, rales, rhonchi. No use of accessory muscles of respiration.  CARDIOVASCULAR: S1, S2 normal. No murmurs, rubs, or  gallops.  ABDOMEN: Soft, nontender, nondistended. Bowel sounds present. No organomegaly or mass.  EXTREMITIES: No cyanosis, clubbing or edema b/l. Right hip w/ hemovac.     NEUROLOGIC: Cranial nerves II through XII are intact. No focal Motor or sensory deficits b/l.   PSYCHIATRIC: The patient is alert and oriented x 3. Good affect.  SKIN: No obvious rash, lesion, or ulcer.   Foley cath in place w/ clear urine draining.    LABORATORY PANEL:   CBC  Recent Labs Lab 11/23/14 0527  WBC 6.9  HGB 11.3*  HCT 32.7*  PLT 112*   ------------------------------------------------------------------------------------------------------------------  Chemistries   Recent Labs Lab 11/22/14 0821 11/23/14 0527  NA 141 142  K 3.2* 3.3*  CL 101 106  CO2 25 28  GLUCOSE 121* 148*  BUN 26* 21*  CREATININE 1.00 0.96  CALCIUM 9.9 8.5*  AST 32  --   ALT 21  --   ALKPHOS 50  --   BILITOT 0.9  --    ------------------------------------------------------------------------------------------------------------------  Cardiac Enzymes  Recent Labs Lab 11/22/14 0821  TROPONINI <0.03   ------------------------------------------------------------------------------------------------------------------  RADIOLOGY:  Dg Chest 1 View  11/22/2014   CLINICAL DATA:  Fall, right hip and shoulder pain.  EXAM: CHEST  1 VIEW  COMPARISON:  03/10/2014  FINDINGS: Heart is mildly enlarged. Low lung volumes. No confluent opacities or effusions. No pneumothorax.  There is a comminuted, mildly displaced right humeral neck fracture.  IMPRESSION: Mild cardiomegaly and low lung volumes. No acute cardiopulmonary disease.  Comminuted, mildly displaced right humeral neck fracture.   Electronically Signed   By: Charlett Nose M.D.   On: 11/22/2014 09:23  Dg Shoulder Right  11/22/2014   CLINICAL DATA:  Fall, right shoulder pain.  EXAM: RIGHT SHOULDER - 2+ VIEW  COMPARISON:  None.  FINDINGS: There is a comminuted right humeral  neck fracture. Fracture fragments along the greater tuberosity of mildly displaced. The humeral neck is displaced medially slightly relative to the humeral head. Mild impaction. No subluxation or dislocation.  IMPRESSION: Mildly displaced, comminuted and impacted right humeral neck fracture.   Electronically Signed   By: Charlett Nose M.D.   On: 11/22/2014 09:24   Ct Head Wo Contrast  11/22/2014   CLINICAL DATA:  Larey Seat.  Hit head.  Head and neck pain.  EXAM: CT HEAD WITHOUT CONTRAST  CT CERVICAL SPINE WITHOUT CONTRAST  TECHNIQUE: Multidetector CT imaging of the head and cervical spine was performed following the standard protocol without intravenous contrast. Multiplanar CT image reconstructions of the cervical spine were also generated.  COMPARISON:  01/14/2014  FINDINGS: CT HEAD FINDINGS  Mild stable age advanced cerebral atrophy, ventriculomegaly and periventricular white matter disease. No acute intracranial findings. No mass lesion. The brainstem and cerebellum appear normal.  No acute skull fracture. The paranasal sinuses and mastoid air cells are clear. The globes are intact.  CT CERVICAL SPINE FINDINGS  Advanced degenerative cervical spondylosis with multilevel disc disease and facet disease. The vertebral bodies are normally aligned. No acute fracture or abnormal prevertebral soft tissue swelling. The facets are normally aligned. No facet or laminar fractures.  The skullbase C1 and C1-2 articulations are maintained. The dens is intact. The lung apices are clear.  IMPRESSION: 1. No acute intracranial findings or skull fracture. 2. Advanced degenerative cervical spondylosis but no acute cervical spine fracture.   Electronically Signed   By: Rudie Meyer M.D.   On: 11/22/2014 09:08   Ct Cervical Spine Wo Contrast  11/22/2014   CLINICAL DATA:  Larey Seat.  Hit head.  Head and neck pain.  EXAM: CT HEAD WITHOUT CONTRAST  CT CERVICAL SPINE WITHOUT CONTRAST  TECHNIQUE: Multidetector CT imaging of the head and cervical  spine was performed following the standard protocol without intravenous contrast. Multiplanar CT image reconstructions of the cervical spine were also generated.  COMPARISON:  01/14/2014  FINDINGS: CT HEAD FINDINGS  Mild stable age advanced cerebral atrophy, ventriculomegaly and periventricular white matter disease. No acute intracranial findings. No mass lesion. The brainstem and cerebellum appear normal.  No acute skull fracture. The paranasal sinuses and mastoid air cells are clear. The globes are intact.  CT CERVICAL SPINE FINDINGS  Advanced degenerative cervical spondylosis with multilevel disc disease and facet disease. The vertebral bodies are normally aligned. No acute fracture or abnormal prevertebral soft tissue swelling. The facets are normally aligned. No facet or laminar fractures.  The skullbase C1 and C1-2 articulations are maintained. The dens is intact. The lung apices are clear.  IMPRESSION: 1. No acute intracranial findings or skull fracture. 2. Advanced degenerative cervical spondylosis but no acute cervical spine fracture.   Electronically Signed   By: Rudie Meyer M.D.   On: 11/22/2014 09:08   Ct Shoulder Right Wo Contrast  11/22/2014   CLINICAL DATA:  Proximal RIGHT humerus fracture. Fall. Comminuted proximal RIGHT humerus fracture on shoulder radiographs.  EXAM: CT OF THE RIGHT SHOULDER WITHOUT CONTRAST  TECHNIQUE: Multidetector CT imaging was performed according to the standard protocol. Multiplanar CT image reconstructions were also generated.  COMPARISON:  11/22/2014.  FINDINGS: There is a 3 part fracture of the proximal RIGHT humerus. The greater tuberosity and proximal metaphysis  are displaced greater than 1 cm. Most of the humeral head articular surface is intact. Subchondral sclerosis and cysts are present in the humeral head, compatible with old AVN with secondary degenerative changes. This does not appear to involve the bare area of the humerus and instead appears along the  posterior humeral head articular surface.  The anterior inferior glenoid shows a small bone fragment and cortical defect compatible with an acute fracture of the glenoid. This is compatible with a tiny bony Bankart although it is unclear whether this shoulder dislocated with this injury.  The visible RIGHT chest shows subsegmental atelectasis or scarring in the RIGHT middle lobe. No displaced rib fractures. The RIGHT clavicle is intact. Type 3 acromion. Undersurface acromial spurring with distal clavicle hypertrophy.  Mild SUPRASPINATUS muscular atrophy is present compatible with chronic partial tear. INFRASPINATUS and SUBSCAPULARIS muscle bulk is preserved.  Lipohemarthrosis is present in the subacromial/subdeltoid bursa.  IMPRESSION: Three part proximal RIGHT humerus fracture. Tiny acute bony Bankart fracture. Subchondral sclerosis and cystic change in the posterior humeral head compatible with AVN and secondary osteoarthritis. Mild SUPRASPINATUS muscular atrophy suggesting a chronic partial tear.   Electronically Signed   By: Andreas NewportGeoffrey  Lamke M.D.   On: 11/22/2014 12:11   Ct Hip Right Wo Contrast  11/22/2014   CLINICAL DATA:  RIGHT hip fracture. Further characterization of hip fracture.  EXAM: CT OF THE RIGHT HIP WITHOUT CONTRAST  TECHNIQUE: Multidetector CT imaging of the right hip was performed according to the standard protocol. Multiplanar CT image reconstructions were also generated.  COMPARISON:  Radiographs 11/22/2014.  FINDINGS: There is an acute cervical RIGHT femoral neck fracture. No intertrochanteric extension is identified. Femoral head remains located. Pre-existing osteoarthritis of the RIGHT hip appears mild.  Displacement and impaction are mild. Approximately 8 mm superior displacement of the distal femur relative to the neck.  Fat containing RIGHT inguinal hernia is present. Foley catheter present in the urinary bladder. Gluteal muscles appear within normal limits.  IMPRESSION: Acute RIGHT  cervical femoral neck fracture.   Electronically Signed   By: Andreas NewportGeoffrey  Lamke M.D.   On: 11/22/2014 11:43   Dg Hip Unilat With Pelvis 1v Right  11/23/2014   CLINICAL DATA:  Postop right hip surgery  EXAM: RIGHT HIP (WITH PELVIS) 1 VIEW  COMPARISON:  11/22/2014  FINDINGS: Interval bipolar right hip hemiarthroplasty. The prosthesis is located and well-seated. No new osseous abnormality seen in the pelvis or left hip.  IMPRESSION: Unremarkable new right hip hemiarthroplasty.   Electronically Signed   By: Marnee SpringJonathon  Watts M.D.   On: 11/23/2014 00:45   Dg Hip Unilat With Pelvis 2-3 Views Right  11/22/2014   CLINICAL DATA:  81 year old who fell and injured the right hip.  EXAM: RIGHT HIP (WITH PELVIS) 2-3 VIEWS  COMPARISON:  None.  FINDINGS: Comminuted basicervical right femoral neck fracture. Hip joint anatomically aligned with mild to moderate medial joint space narrowing. Osseous demineralization.  Included AP pelvis demonstrates no fractures elsewhere. Sacroiliac joints and symphysis pubis intact with degenerative changes. Symmetric mild to moderate medial joint space narrowing involving the contralateral left hip. Degenerative changes involving the lower lumbar spine with prior augmentation of L4.  IMPRESSION: Acute traumatic comminuted basicervical right femoral neck fracture.   Electronically Signed   By: Hulan Saashomas  Lawrence M.D.   On: 11/22/2014 09:25     ASSESSMENT AND PLAN:   81 year old female with past medical history of mild dementia, early cognitive decline, hypertension, hyperlipidemia, history of vertebral fracture who presented to the  hospital after a mechanical fall and noted to have a right humeral and right hip fracture.  #1 right hip fracture-status post right hip arthroplasty. Postoperative day #1. -Continue pain control, care as per orthopedics. -Continue physical therapy as tolerated.  #2 right shoulder fracture-await further input from orthopedics regarding further  intervention. -Right arm in a sling, continue pain control.  #3 hypertension-hemodynamically stable. Continue Norvasc, lisinopril.  #4 hyperlipidemia-continue simvastatin.  #5 history of urinary incontinence-continue Ditropan.  #6 history of dementia-continue Aricept   All the records are reviewed and case discussed with Care Management/Social Workerr. Management plans discussed with the patient, family and they are in agreement.  CODE STATUS: Full  DVT Prophylaxis: Lovenox.   TOTAL TIME TAKING CARE OF THIS PATIENT: 30 minutes.   POSSIBLE D/C IN 2-3 DAYS, DEPENDING ON CLINICAL CONDITION.   Houston Siren M.D on 11/23/2014 at 10:11 AM  Between 7am to 6pm - Pager - [phone]  After 6pm go to [email or web address] - password [email or web address] [facility]  [facility]  Office  [phone]  CC: Primary care physician; Leim Fabry, MD

## 2014-11-23 NOTE — Clinical Social Work Note (Signed)
Clinical Social Work Assessment  Patient Details  Name: Rebecca Reed MRN: [id number] Date of Birth: 08/10/1931  Date of referral:  11/23/14               Reason for consult:  Facility Placement                Permission sought to share information with:  Chartered certified accountant granted to share information::  Yes, Verbal Permission Granted  Name::      Retail buyer::   [place]   Relationship::     Contact Information:     Housing/Transportation Living arrangements for the past 2 months:  [place] of Information:  Patient, Spouse Patient Interpreter Needed:  None Criminal Activity/Legal Involvement Pertinent to Current Situation/Hospitalization:  No - Comment as needed Significant Relationships:  Adult Children, Spouse Lives with:  Spouse Do you feel safe going back to the place where you live?  Yes Need for family participation in patient care:  Yes (Comment)  Care giving concerns:  Patient is an independent living resident at [facility].    Social Worker assessment / plan:  Holiday representative (CSW) received SNF consult. PT is recommending SNF. Per Seth Bake admissions coordinator at [facility] patient is an Chief Strategy Officer at [employer].  CSW met with patient to discuss D/C plan. Patient appeared pleasantly confused. Patient believed she was at [facility]. CSW contacted patient's husband Trilby Drummer. Per husband him and patient are independent living at [facility]. Patient is agreeable for patient to go to [facility] rehab building. Per husband patient has been to the rehab building many times before. Patient had an admission on the neuro floor at [facility] for back surgery in August 2015 per husband.  FL2 complete and faxed out. Plan is for patient to D/C to [facility] Thursday 11/25/14.    Employment status:  Retired Forensic scientist:  Medicare PT Recommendations:  [place] /  Referral to community resources:  Rule  Patient/Family's Response to care:  Patient's husband is agreeable for patient to go to [employer] for rehab.   Patient/Family's Understanding of and Emotional Response to Diagnosis, Current Treatment, and Prognosis:  Patient and husband thanked CSW for visit and assisting with placement.   Emotional Assessment Appearance:    Attitude/Demeanor/Rapport:    Affect (typically observed):  Pleasant, Quiet Orientation:  Oriented to Self, Fluctuating Orientation (Suspected and/or reported Sundowners) Alcohol / Substance use:  Not Applicable Psych involvement (Current and /or in the community):  No (Comment)  Discharge Needs  Concerns to be addressed:  Discharge Planning Concerns Readmission within the last 30 days:  No Current discharge risk:  Cognitively Impaired Barriers to Discharge:  Continued Medical Work up   Loralyn Freshwater, LCSW 11/23/2014, 3:25 PM

## 2014-11-23 NOTE — Progress Notes (Signed)
Postop check  Subjective: Patient is status post right hip hemiarthroplasty; she complains of right shoulder pain,  Exam: Afebrile vital signs stable Right upper extremity: In sling with ice and placed Right lower extremity: Gross motor and light touch sensation examination intact, 2+ dorsalis pedis and posterior tibialis pulse  Right hip x-rays show implants in excellent position with appropriate leg length and offset, no fracture noted, excellent cement mantle  Impression: Status post right hip hemiarthroplasty doing well Comminuted 4 part right proximal humerus fracture  Plan: Start Lovenox tomorrow for DVT prophylaxis Weightbearing as tolerated right lower extremity, nonweightbearing right upper extremity Teds and sequentials bilateral lower extremities Probable need for right shoulder surgical management in the next 1-2 weeks Posterior hip precautions right side Findings related to patient's husband; Dr. Martha ClanKrasinski will assume care tomorrow morning

## 2014-11-23 NOTE — Anesthesia Postprocedure Evaluation (Signed)
  Anesthesia Post-op Note  Patient: Rebecca Reed  Procedure(s) Performed: Procedure(s): ARTHROPLASTY BIPOLAR HIP (HEMIARTHROPLASTY) (Right)  Anesthesia type:Spinal  Patient location: 146  Post pain: Pain level controlled  Post assessment: Post-op Vital signs reviewed, Patient's Cardiovascular Status Stable, Respiratory Function Stable, Patent Airway and No signs of Nausea or vomiting  Post vital signs: Reviewed and stable  Last Vitals:  Filed Vitals:   11/23/14 0639  BP: 171/59  Pulse: 67  Temp: 36.7 C  Resp: 16    Level of consciousness: awake, alert  and patient cooperative  Complications: No apparent anesthesia complications

## 2014-11-23 NOTE — Evaluation (Signed)
Physical Therapy Evaluation Patient Details Name: Rebecca Reed MRN: [id number] DOB: 09/29/1931 Today's Date: 11/23/2014   History of Present Illness  Rebecca Reed is a 81 y.o. female with a known history of hypertension, cognitive decline, thrombocytopenia, hyperlipidemia, back pain, history of vertebral fracture, who presented to the hospital after mechanical fall and noted to have a right humeral and also right hip fracture. Patient apparently was attempting to get out of bed to get to her walker when she lost her balance and fell to the floor. Patient denies any prodromal symptoms prior to her fall like any chest pain, shortness of breath, palpitations, seizures, syncope or any other associated symptoms. Pt underwent R hip hemiarthroplasty and is POD#1. In addition pt with R humeral fracture and she is NWB RUE. POC for RUE to be determined by orthopedist today. Pt reports history of 2 falls in the last 12 months. She lives at [facility] independent living with husband.   Clinical Impression  Pt presents with cognitive impairment making recall of hip precautions and NWB RUE precautions difficulty for her to follow. She demonstrates significant assistance for bed mobility and transfers and is unable to truly ambulate at this time. Pt will need SNF placement at discharge in order to return to full function at home. Pt will benefit from skilled PT services to address deficits in strength, balance, and mobility.     Follow Up Recommendations SNF    Equipment Recommendations   (Hemiwalker, can be delivered by SNF)    Recommendations for Other Services       Precautions / Restrictions Precautions Precautions: Posterior Hip Precaution Booklet Issued: Yes (comment) Precaution Comments: R Required Braces or Orthoses: Sling (R shoulder sling; Abduction wedge in bed) Restrictions Weight Bearing Restrictions: Yes RUE Weight Bearing: Non weight bearing RLE Weight Bearing: Weight bearing as  tolerated      Mobility  Bed Mobility Overal bed mobility: Needs Assistance;+2 for physical assistance Bed Mobility: Supine to Sit;Sit to Supine     Supine to sit: Max assist;+2 for physical assistance Sit to supine: Max assist;+2 for physical assistance   General bed mobility comments: Pt with poor strength in RLE and poor sequencing. RUE in sling and pt continually tries to utilize to assist. Requires repeated reminders that she cannot use RUE. Pt requires repeated reminders to avoid violating posterior hip precautions  Transfers Overall transfer level: Needs assistance Equipment used: Hemi-walker (in LUE) Transfers: Sit to/from Stand Sit to Stand: Min assist;+2 physical assistance         General transfer comment: Cues for hand placement and sequencing. Pt still attempts to use RUE. Cues to avoid excessive hip flexion during transfers. Decreased weight acceptance to RLE  Ambulation/Gait Ambulation/Gait assistance: Mod assist;+2 physical assistance   Assistive device: Hemi-walker (in LUE) Gait Pattern/deviations: Step-to pattern;Decreased weight shift to right;Antalgic     General Gait Details: Pt unable to truly ambulate at this time. Able to take one small step at EOB with heavy cues for sequencing and to avoid using RUE. RLE buckling noted with decreased weight acceptance. Pt unsafe to attempt further ambulation at this time although she appears highly motivated to continue. Able to take 1-2 small side steps at EOB prior to returning to seated position  Stairs            Wheelchair Mobility    Modified Rankin (Stroke Patients Only)       Balance Overall balance assessment: Needs assistance   Sitting balance-Leahy Scale: Fair  Standing balance-Leahy Scale: Poor                               Pertinent Vitals/Pain Pain Assessment: 0-10 Pain Score: 0-No pain ("moderate pain" with mobility, unable to rate) Pain Location: R hip, R  shoulder Pain Intervention(s): Premedicated before session;Monitored during session;Repositioned    Home Living Family/patient expects to be discharged to:: Private residence Living Arrangements: Spouse/significant other Available Help at Discharge: Family Type of Home: House Home Access: Level entry     Home Layout: One level Home Equipment: Grab bars - toilet;Grab bars - tub/shower;Shower seat;Toilet riser;Cane - single point;Walker - 2 wheels;Bedside commode (shower chair)      Prior Function Level of Independence: Needs assistance   Gait / Transfers Assistance Needed: ambulates with cane outside; furniture walks inside  ADL's / Homemaking Assistance Needed: husband does cooking, cleaning and laundry        Hand Dominance   Dominant Hand: Right    Extremity/Trunk Assessment   Upper Extremity Assessment: RUE deficits/detail (LUE grossly WFL)   RUE: Unable to fully assess due to immobilization;Unable to fully assess due to pain       Lower Extremity Assessment: RLE deficits/detail (LLE grossly WFL) RLE Deficits / Details: Partial SLR with assist, full SAQ without assist. Full R ankle DF/PF. Poor weight acceptance with buckling in standing. Poor abdudction/adduction strength due to pain       Communication   Communication: No difficulties  Cognition Arousal/Alertness: Awake/alert Behavior During Therapy: WFL for tasks assessed/performed Overall Cognitive Status: History of cognitive impairments - at baseline (AOx3, slightly disoriented to situation, poor retention)       Memory: Decreased recall of precautions;Decreased short-term memory              General Comments      Exercises Total Joint Exercises Ankle Circles/Pumps: Strengthening;Both;10 reps;Supine Quad Sets: Strengthening;Both;10 reps;Supine Gluteal Sets: Strengthening;Both;10 reps;Supine Towel Squeeze: Strengthening;Both;10 reps;Supine Short Arc Quad: Strengthening;Both;10 reps;Supine Heel  Slides: Strengthening;Both;10 reps;Supine Hip ABduction/ADduction: Strengthening;Both;10 reps;Supine Straight Leg Raises: Strengthening;Both;10 reps;Supine      Assessment/Plan    PT Assessment Patient needs continued PT services  PT Diagnosis Difficulty walking;Generalized weakness;Acute pain   PT Problem List Decreased strength;Decreased range of motion;Decreased activity tolerance;Decreased balance;Decreased mobility;Decreased coordination;Decreased cognition;Decreased knowledge of use of DME;Decreased safety awareness;Decreased knowledge of precautions;Pain  PT Treatment Interventions DME instruction;Gait training;Stair training;Functional mobility training;Therapeutic activities;Therapeutic exercise;Balance training;Neuromuscular re-education;Modalities   PT Goals (Current goals can be found in the Care Plan section) Acute Rehab PT Goals Patient Stated Goal: "I really want to be able to walk" PT Goal Formulation: With patient/family Time For Goal Achievement: 12/07/14 Potential to Achieve Goals: Fair    Frequency BID   Barriers to discharge        Co-evaluation               End of Session Equipment Utilized During Treatment: Gait belt (R shoulder sling) Activity Tolerance: Patient limited by pain Patient left: in bed;with call bell/phone within reach;with bed alarm set;with family/visitor present;with SCD's reapplied Nurse Communication:  (CNA notified: need ice for hip pack)         Time: 1610-9604 PT Time Calculation (min) (ACUTE ONLY): 40 min   Charges:   PT Evaluation $Initial PT Evaluation Tier I: 1 Procedure PT Treatments $Therapeutic Exercise: 8-22 mins   PT G Codes:       Sharalyn Ink Huprich PT, DPT   Huprich,Jason 11/23/2014,  10:04 AM

## 2014-11-24 LAB — CBC
HCT: 30.9 % — ABNORMAL LOW (ref 35.0–47.0)
Hemoglobin: 11 g/dL — ABNORMAL LOW (ref 12.0–16.0)
MCH: 30.7 pg (ref 26.0–34.0)
MCHC: 35.6 g/dL (ref 32.0–36.0)
MCV: 86.2 fL (ref 80.0–100.0)
PLATELETS: 107 10*3/uL — AB (ref 150–440)
RBC: 3.58 MIL/uL — AB (ref 3.80–5.20)
RDW: 14.3 % (ref 11.5–14.5)
WBC: 10.4 10*3/uL (ref 3.6–11.0)

## 2014-11-24 LAB — BASIC METABOLIC PANEL
Anion gap: 7 (ref 5–15)
BUN: 12 mg/dL (ref 6–20)
CALCIUM: 8.6 mg/dL — AB (ref 8.9–10.3)
CO2: 24 mmol/L (ref 22–32)
Chloride: 107 mmol/L (ref 101–111)
Creatinine, Ser: 0.67 mg/dL (ref 0.44–1.00)
GFR calc Af Amer: >60 mL/min (ref >60)
Glucose, Bld: 144 mg/dL — ABNORMAL HIGH (ref 65–99)
Potassium: 3.2 mmol/L — ABNORMAL LOW (ref 3.5–5.1)
SODIUM: 138 mmol/L (ref 135–145)

## 2014-11-24 LAB — MAGNESIUM: Magnesium: 2 mg/dL (ref 1.7–2.4)

## 2014-11-24 LAB — SURGICAL PATHOLOGY

## 2014-11-24 MED ORDER — ASPIRIN EC 325 MG PO TBEC
DELAYED_RELEASE_TABLET | ORAL | Status: AC
  Administered 2014-11-24: 325 mg
  Filled 2014-11-24: qty 1

## 2014-11-24 MED ORDER — STERILE WATER FOR INJECTION IJ SOLN
INTRAMUSCULAR | Status: AC
  Filled 2014-11-24: qty 10

## 2014-11-24 MED ORDER — CEFAZOLIN SODIUM 1 G IJ SOLR
1.0000 g | Freq: Once | INTRAMUSCULAR | Status: AC
  Administered 2014-11-24: 1 g via INTRAMUSCULAR
  Filled 2014-11-24: qty 10

## 2014-11-24 MED ORDER — BISACODYL 10 MG RE SUPP
10.0000 mg | Freq: Every day | RECTAL | Status: DC | PRN
  Administered 2014-11-24: 10 mg via RECTAL
  Filled 2014-11-24: qty 1

## 2014-11-24 NOTE — Progress Notes (Signed)
[facility] [place] [facility]   PATIENT NAME: Rebecca Reed    MR#:  [id number]  DATE OF BIRTH:  07/11/1931  SUBJECTIVE:  CHIEF COMPLAINT:   Chief Complaint  Patient presents with  . Fall   patient is status post right hip replacement postop day #2. Husband at bedside. Didn't sleep well last night and slightly confused today. Eating better and working with physical therapy well.  REVIEW OF SYSTEMS:    Review of Systems  Constitutional: Negative for fever and chills.  HENT: Negative for congestion and tinnitus.   Eyes: Negative for blurred vision and double vision.  Respiratory: Negative for cough, shortness of breath and wheezing.   Cardiovascular: Negative for chest pain, orthopnea and PND.  Gastrointestinal: Positive for constipation. Negative for nausea, vomiting, abdominal pain and diarrhea.  Genitourinary: Negative for dysuria and hematuria.  Musculoskeletal: Positive for joint pain (right shoulder pain).  Neurological: Negative for dizziness, sensory change and focal weakness.  All other systems reviewed and are negative.  Nutrition: Regular Tolerating Diet: Yes Tolerating PT: Yes    DRUG ALLERGIES:   Allergies  Allergen Reactions  . Other Anaphylaxis    Uncoded Allergy. Allergen: THALLIUM    VITALS:  Blood pressure 106/84, pulse 87, temperature 97.3 F (36.3 C), temperature source Oral, resp. rate 18, height 5\' 3"  (1.6 m), weight 64.411 kg (142 lb), SpO2 100 %.  PHYSICAL EXAMINATION:   Physical Exam  GENERAL:  81 y.o.-year-old patient lying in the bed with no acute distress.  EYES: Pupils equal, round, reactive to light and accommodation. No scleral icterus. Extraocular muscles intact.  HEENT: Head atraumatic, normocephalic. Oropharynx and nasopharynx clear.  NECK:  Supple, no jugular venous distention. No thyroid enlargement, no tenderness.  LUNGS: Normal breath sounds bilaterally, no wheezing, rales, rhonchi. No use of  accessory muscles of respiration.  CARDIOVASCULAR: S1, S2 normal. No murmurs, rubs, or gallops.  ABDOMEN: Soft, nontender, nondistended. Bowel sounds present. No organomegaly or mass.  EXTREMITIES: No cyanosis, clubbing or edema b/l. Right Hip dressing. Right arm in sling.     NEUROLOGIC: Cranial nerves II through XII are intact. No focal Motor or sensory deficits b/l.   PSYCHIATRIC: The patient is alert and oriented x 3. Good affect.  SKIN: No obvious rash, lesion, or ulcer.    LABORATORY PANEL:   CBC  Recent Labs Lab 11/24/14 0538  WBC 10.4  HGB 11.0*  HCT 30.9*  PLT 107*   ------------------------------------------------------------------------------------------------------------------  Chemistries   Recent Labs Lab 11/22/14 0821  11/24/14 0538  NA 141  < > 138  K 3.2*  < > 3.2*  CL 101  < > 107  CO2 25  < > 24  GLUCOSE 121*  < > 144*  BUN 26*  < > 12  CREATININE 1.00  < > 0.67  CALCIUM 9.9  < > 8.6*  MG  --   --  2.0  AST 32  --   --   ALT 21  --   --   ALKPHOS 50  --   --   BILITOT 0.9  --   --   < > = values in this interval not displayed. ------------------------------------------------------------------------------------------------------------------  Cardiac Enzymes  Recent Labs Lab 11/22/14 0821  TROPONINI <0.03   ------------------------------------------------------------------------------------------------------------------  RADIOLOGY:  Dg Hip Unilat With Pelvis 1v Right  11/23/2014   CLINICAL DATA:  Postop right hip surgery  EXAM: RIGHT HIP (WITH PELVIS) 1 VIEW  COMPARISON:  11/22/2014  FINDINGS: Interval bipolar  right hip hemiarthroplasty. The prosthesis is located and well-seated. No new osseous abnormality seen in the pelvis or left hip.  IMPRESSION: Unremarkable new right hip hemiarthroplasty.   Electronically Signed   By: Marnee Spring M.D.   On: 11/23/2014 00:45     ASSESSMENT AND PLAN:   81 year old female with past medical history  of mild dementia, early cognitive decline, hypertension, hyperlipidemia, history of vertebral fracture who presented to the hospital after a mechanical fall and noted to have a right humeral and right hip fracture.  #1 right hip fracture-status post right hip arthroplasty. Postoperative day #2. -Continue pain control, care as per orthopedics. -Continue physical therapy as tolerated.   #2 right shoulder fracture-as per orthopedics no surgical intervention needed. Continue supportive care -Right arm in a sling, continue pain control.  #3 hypertension-hemodynamically stable. Continue Norvasc, lisinopril.  #4 hyperlipidemia-continue simvastatin.  #5 history of urinary incontinence-continue Ditropan.  #6 history of dementia-continue Aricept  #7 constipation-this is likely secondary to pain meds. -We will give Dulcolax suppository.   All the records are reviewed and case discussed with Care Management/Social Workerr. Management plans discussed with the patient, family and they are in agreement.  CODE STATUS: Full  DVT Prophylaxis: Lovenox.   TOTAL TIME TAKING CARE OF THIS PATIENT: 30 minutes.   POSSIBLE D/C tomorrow to short-term rehabilitation   Houston Siren M.D on 11/24/2014 at 12:56 PM  Between 7am to 6pm - Pager - [phone]  After 6pm go to [email or web address] - password [email or web address] [facility]  [place] [facility]  Office  [phone]  CC: Primary care physician; Leim Fabry, MD

## 2014-11-24 NOTE — Progress Notes (Signed)
Rounds by Dr Hyacinth MeekerMiller

## 2014-11-24 NOTE — Progress Notes (Signed)
Physical Therapy Treatment Patient Details Name: Rebecca Reed MRN: [id number] DOB: 01/14/1932 Today's Date: 11/24/2014    History of Present Illness Rebecca Reed is a 81 y.o. female with a known history of hypertension, cognitive decline, thrombocytopenia, hyperlipidemia, back pain, history of vertebral fracture, who presented to the hospital after mechanical fall and noted to have a right humeral and also right hip fracture. Patient apparently was attempting to get out of bed to get to her walker when she lost her balance and fell to the floor. Patient denies any prodromal symptoms prior to her fall like any chest pain, shortness of breath, palpitations, seizures, syncope or any other associated symptoms. Pt underwent R hip hemiarthroplasty and is POD#1. In addition pt with R humeral fracture and she is NWB RUE. POC for RUE to be determined by orthopedist today. Pt reports history of 2 falls in the last 12 months. [facility] at [facility] with husband.     PT Comments    Pt continues to be significantly limited by cognitive status. She does demonstrates slightly improved R hip abduction strength with bed mobility but she performs worse with transfers and ambulation on this date. Will continue to attempt ambulation during PM session. Pt up in recliner at end of session. Pt will benefit from skilled PT services to address deficits in strength, balance, and mobility in order to return to full function at home.   Follow Up Recommendations  SNF     Equipment Recommendations   (Hemiwalker, can be delivered by SNF)    Recommendations for Other Services       Precautions / Restrictions Precautions Precautions: Posterior Hip Precaution Booklet Issued: Yes (comment) Precaution Comments: R Required Braces or Orthoses: Sling (R shoulder sling; Abduction wedge in bed) Restrictions Weight Bearing Restrictions: Yes RUE Weight Bearing: Non weight bearing RLE Weight Bearing:  Partial weight bearing RLE Partial Weight Bearing Percentage or Pounds: 25-50%    Mobility  Bed Mobility Overal bed mobility: Needs Assistance;+2 for physical assistance Bed Mobility: Supine to Sit     Supine to sit: Max assist;+2 for physical assistance Sit to supine: Max assist;+2 for physical assistance   General bed mobility comments: Pt with poor strength in RLE and poor sequencing. Reports less pain today. Unable to follow commands consistently but does demonstrate some improving R hip abduction strength while moving legs to the R off the EOB. Repeated reminders to avoid violating hip precautions  Transfers Overall transfer level: Needs assistance Equipment used: 1 person hand held assist (in LUE) Transfers: Sit to/from Stand Sit to Stand: +2 physical assistance;Mod assist         General transfer comment: Cues for hand placement and sequencing. Decreased attempts to use RUE today. Initially pt with very poor sequencing and strength. Second attempt pt demonstrates improved sequencing. She still require cues to maintain RLE PWB. Pt pushes through LUE with HHA. Cues to avoid excessive R hip flexion so as not to violate precautions. Performed standing balance with patient while CNA cleans patient. Standing pivot transfer performed to recliner with very small short steps.  Ambulation/Gait             General Gait Details: Attempted forward steps with patient but she is unable to perform on this session. 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)       Memory: Decreased recall of precautions;Decreased short-term memory (0/3 posterior precautions recalled)              Exercises Total Joint Exercises Ankle Circles/Pumps: Strengthening;Both;Supine;10 reps Quad Sets: Strengthening;Both;Supine;10 reps Gluteal Sets: Strengthening;Both;Supine;10 reps Towel Squeeze: Strengthening;Both;Supine;10 reps Short Arc Quad: Strengthening;Both;Supine;10 reps Heel Slides: Strengthening;Both;Supine;10 reps Hip ABduction/ADduction: Strengthening;Both;Supine;10 reps (Also performed abduction x 10 in sitting) Straight Leg Raises: Strengthening;Both;10 reps;Supine Long Arc Quad: 10 reps;Right;Strengthening;Seated    General Comments        Pertinent Vitals/Pain Pain Assessment:  (see subjective) Pain Intervention(s): Monitored during session;Repositioned    Home Living                      Prior Function            PT Goals (current goals can now be found in the care plan section) Acute Rehab PT Goals Patient Stated Goal: "I really want to be able to walk" PT Goal Formulation: With patient/family Time For Goal Achievement: 12/07/14 Potential to Achieve Goals: Fair Progress towards PT goals: Progressing toward goals    Frequency  BID    PT Plan Current plan remains appropriate    Co-evaluation             End of Session Equipment Utilized During Treatment: Gait belt (R shoulder sling) Activity Tolerance: Patient limited by pain Patient left: with call bell/phone within reach;with family/visitor present;in chair;with chair alarm set (ice pack applied to R hip)     Time: 1610-9604 PT Time Calculation (min) (ACUTE ONLY): 27 min  Charges:  $Gait Training: 8-22 mins $Therapeutic Exercise: 8-22 mins                    G Codes:      Sharalyn Ink Huprich PT, DPT   Huprich,Jason 11/24/2014, 12:10 PM

## 2014-11-24 NOTE — Progress Notes (Signed)
Clinical Child psychotherapistocial Worker (CSW) contacted Information systems managerJudy Case Manager with [employer] to inquire if patient was bundle. Per Darel HongJudy since Dr. Astrid DraftsSloboda was surgeon patient is not a Medicare Bundle. CSW made RN Case Manager aware of above. Plan is for patient to D/C to [facility] tomorrow 11/25/14. CSW will continue to follow and assist as needed.   Jetta LoutBailey Morgan, LCSWA [phone]

## 2014-11-24 NOTE — Plan of Care (Signed)
Problem: Consults Goal: Diagnosis- Total Joint Replacement Outcome: Completed/Met Date Met:  11/24/14 Primary Total Hip

## 2014-11-24 NOTE — Progress Notes (Signed)
Physical Therapy Treatment Patient Details Name: Rebecca Reed W Ezzell MRN: [id number] DOB: 10/30/1931 Today's Date: 11/24/2014    History of Present Illness Adriana Mccallumstelle Goldbach is a 81 y.o. female with a known history of hypertension, cognitive decline, thrombocytopenia, hyperlipidemia, back pain, history of vertebral fracture, who presented to the hospital after mechanical fall and noted to have a right humeral and also right hip fracture. Patient apparently was attempting to get out of bed to get to her walker when she lost her balance and fell to the floor. Patient denies any prodromal symptoms prior to her fall like any chest pain, shortness of breath, palpitations, seizures, syncope or any other associated symptoms. Pt underwent R hip hemiarthroplasty and is POD#1. In addition pt with R humeral fracture and she is NWB RUE. POC for RUE to be determined by orthopedist today. Pt reports history of 2 falls in the last 12 months. She lives at [facility] independent living with husband.     PT Comments    Pt demonstrates slight improvement in LE strength this afternoon with transfers. She is actually able to balance for short bout in standing with CGA only. Performed standing marching with patients and LUE HHA to offload RLE in single leg stance. Unclear if pt is truly able to maintain PWB to RLE. Still unable to ambulate at this time. Pt is more confused this afternoon limiting progress. Pt will benefit from skilled PT services to address deficits in strength, balance, and mobility in order to return to full function at home.    Follow Up Recommendations  SNF     Equipment Recommendations   (Hemiwalker, can be delivered by SNF)    Recommendations for Other Services       Precautions / Restrictions Precautions Precautions: Posterior Hip Precaution Booklet Issued: Yes (comment) Precaution Comments: R Required Braces or Orthoses: Sling (R shoulder sling; Abduction wedge in  bed) Restrictions Weight Bearing Restrictions: Yes RUE Weight Bearing: Non weight bearing RLE Weight Bearing: Partial weight bearing RLE Partial Weight Bearing Percentage or Pounds: 25-50%    Mobility  Bed Mobility Overal bed mobility: Needs Assistance;+2 for physical assistance Bed Mobility: Supine to Sit     Supine to sit: Max assist;+2 for physical assistance Sit to supine: Max assist;+2 for physical assistance   General bed mobility comments: Pt with poor awareness and strength. Difficulty sequencing due to RUE NWB in sling. Decreased R hip flexion strength when returning to bed. Care taken to avoid violating hip precautions.  Transfers Overall transfer level: Needs assistance Equipment used: 1 person hand held assist (in LUE) Transfers: Sit to/from Stand Sit to Stand: +2 physical assistance;Min assist         General transfer comment: Cues for hand placement and sequencing. Improving LE strength noted this afternoon. Once upright pt is able to stay standing for bouts of CGA only. Decreased attempts to use RUE this afternoon. Pt encouraged to hook thumb in sling and maintain constant grasp. Initially pt with very poor sequencing and strength but second attempt pt demonstrates improved sequencing. She still require cues to maintain RLE PWB. Pt pushes through LUE with HHA. Cues to avoid excessive R hip flexion so as not to violate precautions. Standing pivot transfer performed to recliner with very small short steps. Improved safety noted this afternoon. Still unclear if pt is truly able to maintain RLE PWB. Attempted some small forward and backward steps but pt unable to truly ambulate at this time.  Ambulation/Gait  General Gait Details: Attempted forward steps with patient but she is unable to perform on this session. Pt able to take small shuffling pivot steps to get from recliner back to bed   Stairs            Wheelchair Mobility    Modified Rankin  (Stroke Patients Only)       Balance     Sitting balance-Leahy Scale: Fair       Standing balance-Leahy Scale: Fair (Short bouts of CGA only for standing, mostly poor balance)                      Cognition Arousal/Alertness: Awake/alert Behavior During Therapy: WFL for tasks assessed/performed Overall Cognitive Status: History of cognitive impairments - at baseline (Significantly more disoriented this afternoon)       Memory: Decreased recall of precautions;Decreased short-term memory              Exercises Total Joint Exercises Ankle Circles/Pumps: Strengthening;Both;Supine;20 reps Quad Sets: Strengthening;Both;Supine;20 reps Gluteal Sets: Strengthening;Both;Supine;20 reps Towel Squeeze: Strengthening;Both;Supine;20 reps Short Arc Quad: Strengthening;Both;Supine;20 reps Heel Slides: Strengthening;Both;Supine;20 reps Hip ABduction/ADduction: Strengthening;Both;Supine;20 reps Straight Leg Raises: Strengthening;Both;Supine;20 reps Long Arc Quad: 10 reps;Right;Strengthening;Seated Marching in Standing: Strengthening;5 reps;Standing;Both    General Comments        Pertinent Vitals/Pain Pain Assessment: No/denies pain (Unclear if accurate. Pt grimaces with position changes) Pain Intervention(s): Monitored during session;Repositioned    Home Living                      Prior Function            PT Goals (current goals can now be found in the care plan section) Acute Rehab PT Goals Patient Stated Goal: "I really want to be able to walk" PT Goal Formulation: With patient/family Time For Goal Achievement: 12/07/14 Potential to Achieve Goals: Fair Progress towards PT goals: Progressing toward goals    Frequency  BID    PT Plan Current plan remains appropriate    Co-evaluation             End of Session Equipment Utilized During Treatment: Gait belt (R shoulder sling) Activity Tolerance: Patient limited by pain Patient left: with  call bell/phone within reach;with family/visitor present;in bed;with bed alarm set (ice pack applied to R hip, abduction wedge in place)     Time: 1339-1410 PT Time Calculation (min) (ACUTE ONLY): 31 min  Charges:  $Gait Training: 8-22 mins $Therapeutic Exercise: 8-22 mins $Therapeutic Activity: 8-22 mins                    G Codes:      Sharalyn Ink Jesselee Poth PT, DPT   Shaunessy Dobratz 11/24/2014, 2:41 PM

## 2014-11-24 NOTE — Progress Notes (Signed)
Subjective: 2 Days Post-Op Procedure(s) (LRB): ARTHROPLASTY BIPOLAR HIP (HEMIARTHROPLASTY) (Right)    Patient reports pain as mild. Right arm hurts most.  hgb stable.    Objective:   VITALS:   Filed Vitals:   11/24/14 0819  BP: 166/82  Pulse: 99  Temp: 98 F (36.7 C)  Resp: 18    Neurologically intact ABD soft Sensation intact distally Intact pulses distally Dorsiflexion/Plantar flexion intact  LABS  Recent Labs  11/22/14 0821 11/23/14 0527 11/24/14 0538  HGB 14.9 11.3* 11.0*  HCT 44.1 32.7* 30.9*  WBC 5.0 6.9 10.4  PLT 144* 112* 107*     Recent Labs  11/22/14 0821 11/23/14 0527 11/24/14 0538  NA 141 142 138  K 3.2* 3.3* 3.2*  BUN 26* 21* 12  CREATININE 1.00 0.96 0.67  GLUCOSE 121* 148* 144*     Recent Labs  11/22/14 0821  INR 0.94     Assessment/Plan: 2 Days Post-Op Procedure(s) (LRB): ARTHROPLASTY BIPOLAR HIP (HEMIARTHROPLASTY) (Right)   Advance diet  Continue PT SNF tomorrow

## 2014-11-24 NOTE — Progress Notes (Signed)
Dr.Miller paged, call back received, informed of patient's iv access issue- unable to start by night shift and day shift.  New orders received to give last dose of ancef 1gm IM.

## 2014-11-24 NOTE — Progress Notes (Signed)
unsucessful in three attempts resiting iv

## 2014-11-24 NOTE — Progress Notes (Signed)
PT WITH LARGE BM AFTER LAXATIVE. LESS AGITATED. RIGHT HIP DRESSING INTACT. [place] . NEURO CHECKS WNL.DENIES ANY PAIN

## 2014-11-24 NOTE — Care Management (Signed)
Important Message  Patient Details  Name: Rebecca Reed MRN: [id number] Date of Birth: 12/19/1931   Medicare Important Message Given:  Yes-second notification given    Olegario MessierKathy A Allmond 11/24/2014, 9:51 AM

## 2014-11-25 MED ORDER — HYDROCODONE-ACETAMINOPHEN 5-325 MG PO TABS: 1.0000 | ORAL_TABLET | ORAL | Status: DC | PRN

## 2014-11-25 MED ORDER — ENOXAPARIN SODIUM 30 MG/0.3ML ~~LOC~~ SOLN: 30.0000 mg | Freq: Two times a day (BID) | SUBCUTANEOUS | Status: DC

## 2014-11-25 NOTE — Clinical Social Work Placement (Signed)
   CLINICAL SOCIAL WORK PLACEMENT  NOTE  Date:  11/25/2014  Patient Details  Name: Rebecca Reed MRN: [id number] Date of Birth: 02/17/1932  Clinical Social Work is seeking post-discharge placement for this patient at the Skilled  Nursing Facility level of care (*CSW will initial, date and re-position this form in  chart as items are completed):  Yes   Patient/family provided with [facility] Clinical Social Work Department's list of facilities offering this level of care within the geographic area requested by the patient (or if unable, by the patient's family).  Yes   Patient/family informed of their freedom to choose among providers that offer the needed level of care, that participate in Medicare, Medicaid or managed care program needed by the patient, have an available bed and are willing to accept the patient.  Yes   Patient/family informed of [facility]'s ownership interest in [facility] and [facility], as well as of the fact that they are under no obligation to receive care at these facilities.  PASRR submitted to EDS on       PASRR number received on       Existing PASRR number confirmed on 11/23/14     FL2 transmitted to all facilities in geographic area requested by pt/family on 11/23/14     FL2 transmitted to all facilities within larger geographic area on       Patient informed that his/her managed care company has contracts with or will negotiate with certain facilities, including the following:        Yes   Patient/family informed of bed offers received.  Patient chooses bed at  [facility]     Physician recommends and patient chooses bed at      Patient to be transferred to  [facility] ) on 11/25/14.  Patient to be transferred to facility by  [facility] )     Patient family notified on 11/25/14 of transfer.  Name of family member notified:   (Patient's husband is at bedside and aware of above. )     PHYSICIAN       Additional  Comment:    _______________________________________________ Haig ProphetMorgan, Dailen Mcclish G, LCSW 11/25/2014, 10:04 AM

## 2014-11-25 NOTE — Discharge Summary (Signed)
[facility] [place] at [facility]   PATIENT NAME: Rebecca Reed    MR#:  [id number]  DATE OF BIRTH:  10/30/1931  DATE OF ADMISSION:  11/22/2014 ADMITTING PHYSICIAN: Houston SirenVivek J Mayola Mcbain, MD  DATE OF DISCHARGE: 11/25/2014  PRIMARY CARE PHYSICIAN: ALDRIDGE,BARBARA, MD    ADMISSION DIAGNOSIS:  Humerus fracture, right, closed, initial encounter [S42.301A] Basicervical fracture of neck of femur, right, closed, initial encounter [S72.041A]  DISCHARGE DIAGNOSIS:  Active Problems:   Closed right hip fracture   SECONDARY DIAGNOSIS:   Past Medical History  Diagnosis Date  . HTN (hypertension)   . Thrombocytopenia   . Memory loss   . Hyperlipidemia   . Low back pain     Chronic  . T11 vertebral fracture     01/14/2014  . Arthritis     HOSPITAL COURSE:   81 year old female with past medical history of mild dementia, early cognitive decline, hypertension, hyperlipidemia, history of vertebral fracture who presented to the hospital after a mechanical fall and noted to have a right humeral and right hip fracture.  #1 right hip fracture-status post right hip arthroplasty. Postoperative day #3. -Patient tolerated the procedure well and is currently doing well with physical therapy and therefore being discharged to a skilled nursing facility for ongoing care. Patient will follow-up with orthopedics in the next couple weeks.  #2 right shoulder fracture-as per orthopedics no surgical intervention needed. Continue supportive care -Right arm in a sling, pain control.  #3 hypertension-patient remained hemodynamically stable. She will continue Norvasc, lisinopril.  #4 hyperlipidemia-continue simvastatin.  #5 history of urinary incontinence-continue Ditropan.  #6 history of dementia-continue Aricept  #7 constipation-this was likely secondary to the pain meds patient was on. It has now resolved with laxatives.  Patient is being discharged to a skilled nursing  facility for ongoing care.  DISCHARGE CONDITIONS:   Stable  CONSULTS OBTAINED:  Treatment Team:  Christena FlakeJohn J Poggi, MD  DRUG ALLERGIES:   Allergies  Allergen Reactions  . Other Anaphylaxis    Uncoded Allergy. Allergen: THALLIUM    DISCHARGE MEDICATIONS:   Current Discharge Medication List    START taking these medications   Details  enoxaparin (LOVENOX) 30 MG/0.3ML injection Inject 0.3 mLs (30 mg total) into the skin every 12 (twelve) hours. Qty: 0 Syringe    HYDROcodone-acetaminophen (NORCO/VICODIN) 5-325 MG per tablet Take 1-2 tablets by mouth every 4 (four) hours as needed for moderate pain. Qty: 30 tablet, Refills: 0      CONTINUE these medications which have NOT CHANGED   Details  acetaminophen (TYLENOL) 500 MG tablet Take 1,500 mg by mouth every 6 (six) hours as needed. For pain. Take 2 tablets (1000mg ) orally in the morning and 1 tablet orally in the evening (along with tylenol pm at night)    amLODipine (NORVASC) 10 MG tablet Take 10 mg by mouth daily.    aspirin 325 MG tablet Take 325 mg by mouth daily.    Calcium Carb-Cholecalciferol (CALCIUM 600 + D) 600-200 MG-UNIT TABS Take 1 tablet by mouth every morning.     carboxymethylcellulose (REFRESH PLUS) 0.5 % SOLN Apply 1-2 drops to eye every morning.    diphenhydramine-acetaminophen (TYLENOL PM) 25-500 MG TABS Take 1 tablet by mouth at bedtime.    docusate sodium (COLACE) 100 MG capsule Take 100 mg by mouth every morning.    donepezil (ARICEPT) 10 MG tablet Take 10 mg by mouth at bedtime.    lisinopril (PRINIVIL,ZESTRIL) 20 MG tablet Take 20 mg by mouth  daily.    loratadine (SM LORATADINE) 5 MG/5ML syrup Take 5 mLs by mouth daily as needed. For allergies.    Multiple Vitamin (MULTIVITAMIN WITH MINERALS) TABS tablet Take 1 tablet by mouth daily.    simvastatin (ZOCOR) 10 MG tablet Take 10 mg by mouth daily.     tolterodine (DETROL) 1 MG tablet Take 1 mg by mouth 2 (two) times daily.      STOP taking these  medications     oxyCODONE (OXY IR/ROXICODONE) 5 MG immediate release tablet          DISCHARGE INSTRUCTIONS:   DIET:  Cardiac diet  DISCHARGE CONDITION:  Stable  ACTIVITY:  Activity as tolerated  OXYGEN:  Home Oxygen: No.   Oxygen Delivery: room air  DISCHARGE LOCATION:  nursing home   If you experience worsening of your admission symptoms, develop shortness of breath, life threatening emergency, suicidal or homicidal thoughts you must seek medical attention immediately by calling 911 or calling your MD immediately  if symptoms less severe.  You Must read complete instructions/literature along with all the possible adverse reactions/side effects for all the Medicines you take and that have been prescribed to you. Take any new Medicines after you have completely understood and accpet all the possible adverse reactions/side effects.   Please note  You were cared for by a hospitalist during your hospital stay. If you have any questions about your discharge medications or the care you received while you were in the hospital after you are discharged, you can call the unit and asked to speak with the hospitalist on call if the hospitalist that took care of you is not available. Once you are discharged, your primary care physician will handle any further medical issues. Please note that NO REFILLS for any discharge medications will be authorized once you are discharged, as it is imperative that you return to your primary care physician (or establish a relationship with a primary care physician if you do not have one) for your aftercare needs so that they can reassess your need for medications and monitor your lab values.     Today   Patient feels better, still has some right shoulder pain. Working well with physical therapy and a bit more awake and alert today.  VITAL SIGNS:  Blood pressure 137/61, pulse 92, temperature 98.4 F (36.9 C), temperature source Oral, resp. rate 16,  height 5\' 3"  (1.6 m), weight 64.411 kg (142 lb), SpO2 98 %.  I/O:   Intake/Output Summary (Last 24 hours) at 11/25/14 0904 Last data filed at 11/24/14 2000  Gross per 24 hour  Intake      0 ml  Output      2 ml  Net     -2 ml    PHYSICAL EXAMINATION:  GENERAL:  81 y.o.-year-old patient lying in the bed with no acute distress.  EYES: Pupils equal, round, reactive to light and accommodation. No scleral icterus. Extraocular muscles intact.  HEENT: Head atraumatic, normocephalic. Oropharynx and nasopharynx clear.  NECK:  Supple, no jugular venous distention. No thyroid enlargement, no tenderness.  LUNGS: Normal breath sounds bilaterally, no wheezing, rales,rhonchi. No use of accessory muscles of respiration.  CARDIOVASCULAR: S1, S2 RRR. No murmurs, rubs, or gallops.  ABDOMEN: Soft, non-tender, non-distended. Bowel sounds present. No organomegaly or mass.  EXTREMITIES: No pedal edema, cyanosis, or clubbing. Right arm/shoulder in sling.   NEUROLOGIC: Cranial nerves II through XII are intact. No focal motor or sensory defecits b/l. Globally weak PSYCHIATRIC: The  patient is alert and oriented x 3. Good affect.  SKIN: No obvious rash, lesion, or ulcer.   DATA REVIEW:   CBC  Recent Labs Lab 11/24/14 0538  WBC 10.4  HGB 11.0*  HCT 30.9*  PLT 107*    Chemistries   Recent Labs Lab 11/22/14 0821  11/24/14 0538  NA 141  < > 138  K 3.2*  < > 3.2*  CL 101  < > 107  CO2 25  < > 24  GLUCOSE 121*  < > 144*  BUN 26*  < > 12  CREATININE 1.00  < > 0.67  CALCIUM 9.9  < > 8.6*  MG  --   --  2.0  AST 32  --   --   ALT 21  --   --   ALKPHOS 50  --   --   BILITOT 0.9  --   --   < > = values in this interval not displayed.  Cardiac Enzymes  Recent Labs Lab 11/22/14 0821  TROPONINI <0.03    Microbiology Results  Results for orders placed or performed during the hospital encounter of 03/10/14  Surgical pcr screen     Status: None   Collection Time: 03/10/14 12:23 PM  Result  Value Ref Range Status   MRSA, PCR NEGATIVE NEGATIVE Final   Staphylococcus aureus NEGATIVE NEGATIVE Final    Comment:        The Xpert SA Assay (FDA approved for NASAL specimens in patients over 4 years of age), is one component of a comprehensive surveillance program.  Test performance has been validated by [employer] for patients greater than or equal to 39 year old. It is not intended to diagnose infection nor to guide or monitor treatment.    RADIOLOGY:  No results found.    Management plans discussed with the patient, family and they are in agreement.  CODE STATUS:     Code Status Orders        Start     Ordered   11/22/14 1148  Full code   Continuous     11/22/14 1147    Advance Directive Documentation        Most Recent Value   Type of Advance Directive  Healthcare Power of Attorney, Living will   Pre-existing out of facility DNR order (yellow form or pink MOST form)     "MOST" Form in Place?        TOTAL TIME TAKING CARE OF THIS PATIENT: 40 minutes.    Houston Siren M.D on 11/25/2014 at 9:04 AM  Between 7am to 6pm - Pager - [phone]  After 6pm go to [email or web address] - password [email or web address] [facility]  [facility]  Office  [phone]  CC: Primary care physician; Leim Fabry, MD

## 2014-11-25 NOTE — Progress Notes (Signed)
Dr

## 2014-11-25 NOTE — Progress Notes (Signed)
PT Attempt Note  Patient Details Name: Rebecca Reed MRN: [id number] DOB: 08/20/1931   Cancelled Treatment:    Reason Eval/Treat Not Completed: Patient at procedure or test/unavailable Attempted to see patient. Spoke with RN who just finished speaking with EMS who said they would be here "right away." Went to attempt bed level exercises with patient but CNA is changing patient in anticipation of discharge. Pt is pending imminent discharge to SNF.  Sharalyn InkJason D Jachai Okazaki PT, DPT   Shardae Kleinman 11/25/2014, 10:34 AM

## 2014-11-25 NOTE — Progress Notes (Signed)
Patient is medically stable for D/C to [facility] today. Per Sue LushAndrea admissions coordinator at [facility] patient is going to a private room 213. RN will call report at [phone] and arrange EMS for transport. Clinical Child psychotherapistocial Worker (CSW) prepared D/C packet and sent D/C Summary and follow up appointments to [facility] via [facility]. Patient's husband is at bedside and aware of above. Please reconsult if future social work needs arise. CSW signing off.   Jetta LoutBailey Morgan, LCSWA [phone]

## 2014-11-25 NOTE — Discharge Instructions (Signed)
°  DIET:  °Cardiac diet ° °DISCHARGE CONDITION:  °Stable ° °ACTIVITY:  °Activity as tolerated ° °OXYGEN:  °Home Oxygen: No. °  °Oxygen Delivery: room air ° °DISCHARGE LOCATION:  °nursing home  ° °If you experience worsening of your admission symptoms, develop shortness of breath, life threatening emergency, suicidal or homicidal thoughts you must seek medical attention immediately by calling 911 or calling your MD immediately  if symptoms less severe. ° °You Must read complete instructions/literature along with all the possible adverse reactions/side effects for all the Medicines you take and that have been prescribed to you. Take any new Medicines after you have completely understood and accpet all the possible adverse reactions/side effects.  ° °Please note ° °You were cared for by a hospitalist during your hospital stay. If you have any questions about your discharge medications or the care you received while you were in the hospital after you are discharged, you can call the unit and asked to speak with the hospitalist on call if the hospitalist that took care of you is not available. Once you are discharged, your primary care physician will handle any further medical issues. Please note that NO REFILLS for any discharge medications will be authorized once you are discharged, as it is imperative that you return to your primary care physician (or establish a relationship with a primary care physician if you do not have one) for your aftercare needs so that they can reassess your need for medications and monitor your lab values. ° ° ° °

## 2014-11-26 DIAGNOSIS — I10 Essential (primary) hypertension: Secondary | ICD-10-CM | POA: Diagnosis not present

## 2014-11-26 DIAGNOSIS — G3184 Mild cognitive impairment, so stated: Secondary | ICD-10-CM

## 2014-11-26 DIAGNOSIS — S7291XA Unspecified fracture of right femur, initial encounter for closed fracture: Secondary | ICD-10-CM

## 2014-11-26 DIAGNOSIS — S42301A Unspecified fracture of shaft of humerus, right arm, initial encounter for closed fracture: Secondary | ICD-10-CM | POA: Diagnosis not present

## 2014-11-30 ENCOUNTER — Encounter: Payer: Self-pay | Admitting: *Deleted

## 2014-11-30 ENCOUNTER — Inpatient Hospital Stay
Admission: EM | Admit: 2014-11-30 | Discharge: 2014-12-02 | DRG: 310 | Disposition: A | Discharge status: Skilled Nursing Facility | Payer: Medicare Other | Attending: Internal Medicine | Admitting: Internal Medicine

## 2014-11-30 DIAGNOSIS — F0390 Unspecified dementia without behavioral disturbance: Secondary | ICD-10-CM | POA: Diagnosis present

## 2014-11-30 DIAGNOSIS — I447 Left bundle-branch block, unspecified: Secondary | ICD-10-CM

## 2014-11-30 DIAGNOSIS — I4891 Unspecified atrial fibrillation: Principal | ICD-10-CM | POA: Diagnosis present

## 2014-11-30 DIAGNOSIS — I10 Essential (primary) hypertension: Secondary | ICD-10-CM | POA: Diagnosis present

## 2014-11-30 DIAGNOSIS — M1990 Unspecified osteoarthritis, unspecified site: Secondary | ICD-10-CM | POA: Diagnosis present

## 2014-11-30 DIAGNOSIS — I4892 Unspecified atrial flutter: Secondary | ICD-10-CM | POA: Diagnosis present

## 2014-11-30 DIAGNOSIS — Z7982 Long term (current) use of aspirin: Secondary | ICD-10-CM | POA: Diagnosis not present

## 2014-11-30 DIAGNOSIS — M545 Low back pain: Secondary | ICD-10-CM | POA: Diagnosis present

## 2014-11-30 DIAGNOSIS — E785 Hyperlipidemia, unspecified: Secondary | ICD-10-CM | POA: Diagnosis present

## 2014-11-30 DIAGNOSIS — D649 Anemia, unspecified: Secondary | ICD-10-CM | POA: Diagnosis present

## 2014-11-30 DIAGNOSIS — Z96643 Presence of artificial hip joint, bilateral: Secondary | ICD-10-CM | POA: Diagnosis present

## 2014-11-30 DIAGNOSIS — R000 Tachycardia, unspecified: Secondary | ICD-10-CM

## 2014-11-30 DIAGNOSIS — M8580 Other specified disorders of bone density and structure, unspecified site: Secondary | ICD-10-CM | POA: Diagnosis present

## 2014-11-30 DIAGNOSIS — G8929 Other chronic pain: Secondary | ICD-10-CM | POA: Diagnosis present

## 2014-11-30 DIAGNOSIS — Z66 Do not resuscitate: Secondary | ICD-10-CM | POA: Diagnosis present

## 2014-11-30 DIAGNOSIS — S42209A Unspecified fracture of upper end of unspecified humerus, initial encounter for closed fracture: Secondary | ICD-10-CM

## 2014-11-30 DIAGNOSIS — S72001A Fracture of unspecified part of neck of right femur, initial encounter for closed fracture: Secondary | ICD-10-CM | POA: Diagnosis present

## 2014-11-30 DIAGNOSIS — Z7901 Long term (current) use of anticoagulants: Secondary | ICD-10-CM

## 2014-11-30 HISTORY — DX: Other specified disorders of bone density and structure, unspecified site: M85.80

## 2014-11-30 HISTORY — DX: Left bundle-branch block, unspecified: I44.7

## 2014-11-30 HISTORY — DX: Unspecified atrial fibrillation: I48.91

## 2014-11-30 HISTORY — DX: Unspecified atrial flutter: I48.92

## 2014-11-30 HISTORY — DX: Unspecified dementia, unspecified severity, without behavioral disturbance, psychotic disturbance, mood disturbance, and anxiety: F03.90

## 2014-11-30 LAB — CBC WITH DIFFERENTIAL/PLATELET
BASOS PCT: 1 % (ref 0–1)
Band Neutrophils: 4 % (ref 0–10)
Basophils Absolute: 0.1 10*3/uL (ref 0.0–0.1)
Blasts: 0 %
Eosinophils Absolute: 0 10*3/uL (ref 0.0–0.7)
Eosinophils Relative: 0 % (ref 0–5)
HCT: 30.4 % — ABNORMAL LOW (ref 35.0–47.0)
Hemoglobin: 9.9 g/dL — ABNORMAL LOW (ref 12.0–16.0)
LYMPHS ABS: 2 10*3/uL (ref 0.7–4.0)
LYMPHS PCT: 21 % (ref 12–46)
MCH: 29.8 pg (ref 26.0–34.0)
MCHC: 32.6 g/dL (ref 32.0–36.0)
MCV: 91.2 fL (ref 80.0–100.0)
MYELOCYTES: 0 %
Metamyelocytes Relative: 3 %
Monocytes Absolute: 1.1 10*3/uL — ABNORMAL HIGH (ref 0.1–1.0)
Monocytes Relative: 11 % (ref 3–12)
NEUTROS ABS: 6.4 10*3/uL (ref 1.7–7.7)
Neutrophils Relative %: 60 % (ref 43–77)
OTHER: 0 %
PLATELETS: 267 10*3/uL (ref 150–440)
PROMYELOCYTES ABS: 0 %
RBC: 3.34 MIL/uL — ABNORMAL LOW (ref 3.80–5.20)
RDW: 14.8 % — ABNORMAL HIGH (ref 11.5–14.5)
WBC: 9.6 10*3/uL (ref 3.6–11.0)
nRBC: 0 /100 WBC

## 2014-11-30 LAB — BASIC METABOLIC PANEL
Anion gap: 7 (ref 5–15)
BUN: 26 mg/dL — AB (ref 6–20)
CALCIUM: 8.5 mg/dL — AB (ref 8.9–10.3)
CO2: 26 mmol/L (ref 22–32)
Chloride: 105 mmol/L (ref 101–111)
Creatinine, Ser: 0.87 mg/dL (ref 0.44–1.00)
GFR calc non Af Amer: >60 mL/min (ref >60)
Glucose, Bld: 117 mg/dL — ABNORMAL HIGH (ref 65–99)
Potassium: 4.1 mmol/L (ref 3.5–5.1)
Sodium: 138 mmol/L (ref 135–145)

## 2014-11-30 LAB — PROTIME-INR
INR: 0.99
Prothrombin Time: 13.3 seconds (ref 11.4–15.0)

## 2014-11-30 LAB — GLUCOSE, CAPILLARY: GLUCOSE-CAPILLARY: 115 mg/dL — AB (ref 65–99)

## 2014-11-30 LAB — TROPONIN I: Troponin I: 0.03 ng/mL (ref <0.031)

## 2014-11-30 LAB — APTT: aPTT: 29 seconds (ref 24–36)

## 2014-11-30 MED ORDER — HEPARIN (PORCINE) IN NACL 100-0.45 UNIT/ML-% IJ SOLN
900.0000 [IU]/h | INTRAMUSCULAR | Status: DC
  Administered 2014-11-30: 900 [IU]/h via INTRAVENOUS
  Filled 2014-11-30: qty 250

## 2014-11-30 MED ORDER — DONEPEZIL HCL 5 MG PO TABS
10.0000 mg | ORAL_TABLET | Freq: Every day | ORAL | Status: DC
  Administered 2014-11-30 – 2014-12-01 (×2): 10 mg via ORAL
  Filled 2014-11-30 (×2): qty 2

## 2014-11-30 MED ORDER — DILTIAZEM HCL 25 MG/5ML IV SOLN
INTRAVENOUS | Status: AC
  Administered 2014-11-30: 20 mg
  Filled 2014-11-30: qty 5

## 2014-11-30 MED ORDER — ENOXAPARIN SODIUM 30 MG/0.3ML ~~LOC~~ SOLN: 30.0000 mg | Freq: Two times a day (BID) | SUBCUTANEOUS | Status: DC

## 2014-11-30 MED ORDER — SIMVASTATIN 10 MG PO TABS
10.0000 mg | ORAL_TABLET | Freq: Every day | ORAL | Status: DC
  Administered 2014-12-01 – 2014-12-02 (×2): 10 mg via ORAL
  Filled 2014-11-30 (×2): qty 1

## 2014-11-30 MED ORDER — OXYBUTYNIN CHLORIDE ER 5 MG PO TB24
5.0000 mg | ORAL_TABLET | Freq: Every day | ORAL | Status: DC
  Administered 2014-12-01 (×2): 5 mg via ORAL
  Filled 2014-11-30 (×5): qty 1

## 2014-11-30 MED ORDER — CARBOXYMETHYLCELLULOSE SODIUM 0.5 % OP SOLN: 1.0000 [drp] | Freq: Every morning | OPHTHALMIC | Status: DC

## 2014-11-30 MED ORDER — ACETAMINOPHEN ER 650 MG PO TBCR
650.0000 mg | EXTENDED_RELEASE_TABLET | Freq: Three times a day (TID) | ORAL | Status: DC
  Filled 2014-11-30 (×5): qty 1

## 2014-11-30 MED ORDER — DOCUSATE SODIUM 100 MG PO CAPS
100.0000 mg | ORAL_CAPSULE | ORAL | Status: DC
  Administered 2014-12-01 – 2014-12-02 (×2): 100 mg via ORAL
  Filled 2014-11-30 (×2): qty 1

## 2014-11-30 MED ORDER — DILTIAZEM HCL 25 MG/5ML IV SOLN
INTRAVENOUS | Status: AC
  Administered 2014-11-30: 15 mg
  Filled 2014-11-30: qty 5

## 2014-11-30 MED ORDER — DEXTROSE 5 % IV SOLN
5.0000 mg/h | Freq: Once | INTRAVENOUS | Status: AC
  Administered 2014-11-30: 5 mg/h via INTRAVENOUS
  Filled 2014-11-30: qty 100

## 2014-11-30 MED ORDER — LISINOPRIL 20 MG PO TABS
20.0000 mg | ORAL_TABLET | Freq: Every day | ORAL | Status: DC
  Administered 2014-12-01 – 2014-12-02 (×2): 20 mg via ORAL
  Filled 2014-11-30 (×2): qty 1

## 2014-11-30 MED ORDER — DILTIAZEM HCL 100 MG IV SOLR
5.0000 mg/h | INTRAVENOUS | Status: DC
  Administered 2014-12-01 (×2): 15 mg/h via INTRAVENOUS
  Filled 2014-11-30 (×2): qty 100

## 2014-11-30 MED ORDER — CALCIUM CARB-CHOLECALCIFEROL 600-200 MG-UNIT PO TABS
1.0000 | ORAL_TABLET | ORAL | Status: DC
  Filled 2014-11-30 (×2): qty 1

## 2014-11-30 MED ORDER — CALCIUM CARBONATE-VITAMIN D 500-200 MG-UNIT PO TABS
1.0000 | ORAL_TABLET | ORAL | Status: DC
  Administered 2014-12-01 – 2014-12-02 (×2): 1 via ORAL
  Filled 2014-11-30 (×2): qty 1

## 2014-11-30 MED ORDER — ASPIRIN 325 MG PO TABS
325.0000 mg | ORAL_TABLET | Freq: Every day | ORAL | Status: DC
  Administered 2014-12-01 – 2014-12-02 (×2): 325 mg via ORAL
  Filled 2014-11-30 (×3): qty 1

## 2014-11-30 NOTE — Consult Note (Signed)
ANTICOAGULATION CONSULT NOTE - Initial Consult  Pharmacy Consult for Heparin  Indication: atrial flutter.   Allergies  Allergen Reactions  . Other Anaphylaxis    Uncoded Allergy. Allergen: THALLIUM    Patient Measurements: Height: 5\' 3"  (160 cm) Weight: 150 lb (68.04 kg) IBW/kg (Calculated) : 52.4 Heparin Dosing Weight: 66.3 kg   Vital Signs: Temp: 98.1 F (36.7 C) (07/12 1756) Temp Source: Oral (07/12 1756) BP: 134/77 mmHg (07/12 2101) Pulse Rate: 150 (07/12 2101)  Labs:  Recent Labs  11/30/14 1805  HGB 9.9*  HCT 30.4*  PLT 267  APTT 29  LABPROT 13.3  INR 0.99  CREATININE 0.87  TROPONINI 0.03    Estimated Creatinine Clearance: 46.9 mL/min (by C-G formula based on Cr of 0.87).   Medical History: Past Medical History  Diagnosis Date  . HTN (hypertension)   . Thrombocytopenia   . Dementia   . Hyperlipidemia   . Low back pain     Chronic  . T11 vertebral fracture     01/14/2014  . Arthritis   . Atrial fibrillation and flutter   . Osteopenia   . Complete left bundle branch block 11/30/2014    Medications:  Scheduled:    Assessment: Patient being treated for atrial fibrillation with rvr. Patient recently admitted for femur repair and put on enoxaparin 30 mg SQ q12 hours. Patient received Lovenox prior to admission however not clear on when last dose was given.  Goal of Therapy:  Heparin level 0.3-0.7 units/ml Monitor platelets by anticoagulation protocol: Yes   Plan:  Pharmacist ordered heparin gtt without bolus due to risk and low Hgb and Hct. Agreed with decision per protocol. Will continue heparin gtt rate @ 900 units/hr. I will order heparin level to be drawn @ 0500.   Montague Corella D 11/30/2014,9:57 PM

## 2014-11-30 NOTE — ED Provider Notes (Signed)
[facility] Emergency Department Provider Note  ____________________________________________  Time seen: Seen on arrival to the emergency department  I have reviewed the triage vital signs and the nursing notes.   HISTORY  Chief Complaint Tachycardia    HPI Rebecca Reed is a 81 y.o. female with a history of recent femur repair and humeral fracture presents today with a rapid heart rate to the 150s. The patient has no complaints right now and says that she was having her vital signs taken at her facility and they found her heart rate to be elevated. She denies any shortness of breath, chest pain, nausea or vomiting.   Past Medical History  Diagnosis Date  . HTN (hypertension)   . Thrombocytopenia   . Memory loss   . Hyperlipidemia   . Low back pain     Chronic  . T11 vertebral fracture     01/14/2014  . Arthritis     Patient Active Problem List   Diagnosis Date Noted  . Closed right hip fracture 11/22/2014  . Compression fracture 03/25/2014  . Compression fracture of body of thoracic vertebra 03/20/2014  . Thoracic compression fracture 03/19/2014  . Essential hypertension, benign 01/15/2014  . Hypokalemia 01/15/2014  . Closed fracture of thoracic vertebra without spinal cord injury 01/14/2014    Past Surgical History  Procedure Laterality Date  . Total knee arthroplasty      bil    . Cholecystectomy    . Abdominal hysterectomy    . Kyphoplasty N/A 03/19/2014    Procedure: Thoracic twelve KYPHOPLASTY;  Surgeon: Maeola HarmanJoseph Stern, MD;  Location: [facility];  Service: Neurosurgery;  Laterality: N/A;  . Hip arthroplasty Right 11/22/2014    Procedure: ARTHROPLASTY BIPOLAR HIP (HEMIARTHROPLASTY);  Surgeon: Kyra SearlesJohn F Sloboda, MD;  Location: [facility] ORS;  Service: Orthopedics;  Laterality: Right;    Current Outpatient Rx  Name  Route  Sig  Dispense  Refill  . acetaminophen (TYLENOL ARTHRITIS PAIN) 650 MG CR tablet   Oral   Take 650 mg by mouth 3  (three) times daily.         Marland Kitchen. alendronate (FOSAMAX) 70 MG tablet   Oral   Take 70 mg by mouth once a week. Take with a full glass of water on an empty stomach.         Marland Kitchen. amLODipine (NORVASC) 10 MG tablet   Oral   Take 10 mg by mouth daily.         Marland Kitchen. aspirin 325 MG tablet   Oral   Take 325 mg by mouth daily.         . Calcium Carb-Cholecalciferol (CALCIUM 600 + D) 600-200 MG-UNIT TABS   Oral   Take 1 tablet by mouth every morning.          . carboxymethylcellulose (REFRESH PLUS) 0.5 % SOLN   Ophthalmic   Apply 1-2 drops to eye every morning.         . docusate sodium (COLACE) 100 MG capsule   Oral   Take 100 mg by mouth every morning.         . donepezil (ARICEPT) 10 MG tablet   Oral   Take 10 mg by mouth at bedtime.         . enoxaparin (LOVENOX) 30 MG/0.3ML injection   Subcutaneous   Inject 0.3 mLs (30 mg total) into the skin every 12 (twelve) hours.   0 Syringe      . lisinopril (PRINIVIL,ZESTRIL) 20 MG  tablet   Oral   Take 20 mg by mouth daily.         Marland Kitchen loratadine (SM LORATADINE) 5 MG/5ML syrup   Oral   Take 5 mLs by mouth daily as needed. For allergies.         . Multiple Vitamin (MULTIVITAMIN WITH MINERALS) TABS tablet   Oral   Take 1 tablet by mouth daily.         . simvastatin (ZOCOR) 10 MG tablet   Oral   Take 10 mg by mouth daily.          Marland Kitchen tolterodine (DETROL) 1 MG tablet   Oral   Take 1 mg by mouth 2 (two) times daily.         . Vitamin D, Ergocalciferol, (DRISDOL) 50000 UNITS CAPS capsule   Oral   Take 50,000 Units by mouth every 7 (seven) days.         Marland Kitchen acetaminophen (TYLENOL) 500 MG tablet   Oral   Take 1,500 mg by mouth every 6 (six) hours as needed. For pain. Take 2 tablets (1000mg ) orally in the morning and 1 tablet orally in the evening (along with tylenol pm at night)         . diphenhydramine-acetaminophen (TYLENOL PM) 25-500 MG TABS   Oral   Take 1 tablet by mouth at bedtime.         Marland Kitchen  HYDROcodone-acetaminophen (NORCO/VICODIN) 5-325 MG per tablet   Oral   Take 1-2 tablets by mouth every 4 (four) hours as needed for moderate pain.   30 tablet   0   . Multiple Vitamin (MULTIVITAMIN WITH MINERALS) TABS tablet   Oral   Take 1 tablet by mouth daily.           Allergies Other  Family History  Problem Relation Age of Onset  . Alzheimer's disease Mother   . Alzheimer's disease Sister     Social History History  Substance Use Topics  . Smoking status: Never Smoker   . Smokeless tobacco: Not on file  . Alcohol Use: No    Review of Systems Constitutional: No fever/chills Eyes: No visual changes. ENT: No sore throat. Cardiovascular: Denies chest pain. Respiratory: Denies shortness of breath. Gastrointestinal: No abdominal pain.  No nausea, no vomiting.  No diarrhea.  No constipation. Genitourinary: Negative for dysuria. Musculoskeletal: Negative for back pain. Skin: Negative for rash. Neurological: Negative for headaches, focal weakness or numbness.  10-point ROS otherwise negative.  ____________________________________________   PHYSICAL EXAM:  VITAL SIGNS: ED Triage Vitals  Enc Vitals Group     BP 11/30/14 1756 126/69 mmHg     Pulse Rate 11/30/14 1756 155     Resp 11/30/14 1756 18     Temp 11/30/14 1756 98.1 F (36.7 C)     Temp Source 11/30/14 1756 Oral     SpO2 11/30/14 1756 100 %     Weight 11/30/14 1756 150 lb (68.04 kg)     Height 11/30/14 1756 5\' 3"  (1.6 m)     Head Cir --      Peak Flow --      Pain Score 11/30/14 1841 0     Pain Loc --      Pain Edu? --      Excl. in GC? --     Constitutional: Alert and oriented. Well appearing and in no acute distress. Eyes: Conjunctivae are normal. PERRL. EOMI. Head: Atraumatic. Nose: No congestion/rhinnorhea. Mouth/Throat: Mucous membranes are moist.  Oropharynx non-erythematous. Neck: No stridor.   Cardiovascular: Tachycardic, regular rhythm. Grossly normal heart sounds.  Good peripheral  circulation. Respiratory: Normal respiratory effort.  No retractions. Lungs CTAB. Gastrointestinal: Soft and nontender. No distention. No abdominal bruits. No CVA tenderness. Musculoskeletal: No lower extremity tenderness nor edema.  No joint effusions. Incision to the right lateral thigh is clean and dry and intact. Right upper extremity in a sling with ecchymosis over the humerus. Neurologic:  Normal speech and language. No gross focal neurologic deficits are appreciated. Speech is normal. No gait instability. Skin:  Skin is warm, dry and intact. No rash noted. Psychiatric: Mood and affect are normal. Speech and behavior are normal.  ____________________________________________   LABS (all labs ordered are listed, but only abnormal results are displayed)  Labs Reviewed  CBC WITH DIFFERENTIAL/PLATELET - Abnormal; Notable for the following:    RBC 3.34 (*)    Hemoglobin 9.9 (*)    HCT 30.4 (*)    RDW 14.8 (*)    All other components within normal limits  BASIC METABOLIC PANEL - Abnormal; Notable for the following:    Glucose, Bld 117 (*)    BUN 26 (*)    Calcium 8.5 (*)    All other components within normal limits  TROPONIN I   ____________________________________________  EKG  ED ECG REPORT I, Arelia Longest, the attending physician, personally viewed and interpreted this ECG.   Date: 11/30/2014  EKG Time: 1755  Rate: 152  Rhythm: Supraventricular tachycardia  Axis: Normal axis  Intervals:none and left bundle branch block  ST&T Change: T wave inversions in 1 and aVL.   ED ECG REPORT I, Arelia Longest, the attending physician, personally viewed and interpreted this ECG.   Date: 11/30/2014  EKG Time: 1856  Rate: 104  Rhythm: left axis deviation  Axis: A flutter with variable conduction  Intervals left bundle branch block  ST&T Change: No ST elevations or depressions. T-wave inversions in 1 and aVL.  _______________________ left bundle branch block seen  on EKG from 11/22/2014. _____________________  RADIOLOGY   ____________________________________________   PROCEDURES CRITICAL CARE Performed by: Arelia Longest   Total critical care time: 45 minutes  Critical care time was exclusive of separately billable procedures and treating other patients.  Critical care was necessary to treat or prevent imminent or life-threatening deterioration.  Critical care was time spent personally by me on the following activities: development of treatment plan with patient and/or surrogate as well as nursing, discussions with consultants, evaluation of patient's response to treatment, examination of patient, obtaining history from patient or surrogate, ordering and performing treatments and interventions, ordering and review of laboratory studies, ordering and review of radiographic studies, pulse oximetry and re-evaluation of patient's condition.  ____________________________________________   INITIAL IMPRESSION / ASSESSMENT AND PLAN / ED COURSE  Pertinent labs & imaging results that were available during my care of the patient were reviewed by me and considered in my medical decision making (see chart for details).  ----------------------------------------- 7:58 PM on 11/30/2014 -----------------------------------------  Patient requiring multiple boluses of Cardizem. Given 15 mg and then 20 mg. After 20 mg the patient broke to what appears to be a flutter pattern. I discussed the EKG with Dr. Ihor Austin who thinks that this is indeed flutter. Back to rate of 150s after about half an hour after second dose of Cardizem.  Will begin Cardizem drip and start heparin. Signed out to Dr. Anne Hahn. ____________________________________________   FINAL CLINICAL IMPRESSION(S) / ED  DIAGNOSES  Acute atrial flutter with rapid ventricular response. Initial visit.      Myrna Blazer, MD 11/30/14 2039

## 2014-11-30 NOTE — ED Notes (Signed)
Alinda MoneyMelvin Pts husband.  [phone]

## 2014-11-30 NOTE — H&P (Signed)
[facility] [place] [facility]   PATIENT NAME: Rebecca Reed    MR#:  [id number]  DATE OF BIRTH:  08/26/1931  DATE OF ADMISSION:  11/30/2014  PRIMARY CARE PHYSICIAN: Leim FabryALDRIDGE,BARBARA, MD   REQUESTING/REFERRING PHYSICIAN: Schaevitz  CHIEF COMPLAINT:   Chief Complaint  Patient presents with  . Tachycardia    HISTORY OF PRESENT ILLNESS:  Rebecca Mccallumstelle Schweiss  is a 81 y.o. female who presents with A. fib with RVR. Patient was recently admitted here for femur repair and humeral fracture, and was discharged to a rehabilitation facility. She was having her vital signs checked today when she was found to have significant tachycardia, and her blood pressure was significantly elevated. She was sent to the ED for the same. The patient is asymptomatic, and labs are largely within normal limits, pertinently including a negative troponin. She was given a couple doses of IV Cardizem which slowed her heart rate, but then she bounced back up to the 150s. So she was started on a Cardizem drip, and hospitalists were called for admission.  PAST MEDICAL HISTORY:   Past Medical History  Diagnosis Date  . HTN (hypertension)   . Thrombocytopenia   . Dementia   . Hyperlipidemia   . Low back pain     Chronic  . T11 vertebral fracture     01/14/2014  . Arthritis   . Atrial fibrillation and flutter   . Osteopenia   . Complete left bundle branch block 11/30/2014    PAST SURGICAL HISTORY:   Past Surgical History  Procedure Laterality Date  . Total knee arthroplasty      bil    . Cholecystectomy    . Abdominal hysterectomy    . Kyphoplasty N/A 03/19/2014    Procedure: Thoracic twelve KYPHOPLASTY;  Surgeon: Maeola HarmanJoseph Stern, MD;  Location: [facility];  Service: Neurosurgery;  Laterality: N/A;  . Hip arthroplasty Right 11/22/2014    Procedure: ARTHROPLASTY BIPOLAR HIP (HEMIARTHROPLASTY);  Surgeon: Kyra SearlesJohn F Sloboda, MD;  Location: [facility] ORS;  Service: Orthopedics;  Laterality:  Right;    SOCIAL HISTORY:   History  Substance Use Topics  . Smoking status: Never Smoker   . Smokeless tobacco: Not on file  . Alcohol Use: No    FAMILY HISTORY:   Family History  Problem Relation Age of Onset  . Alzheimer's disease Mother   . Alzheimer's disease Sister     DRUG ALLERGIES:   Allergies  Allergen Reactions  . Other Anaphylaxis    Uncoded Allergy. Allergen: THALLIUM    MEDICATIONS AT HOME:   Prior to Admission medications   Medication Sig Start Date End Date Taking? Authorizing Provider  acetaminophen (TYLENOL ARTHRITIS PAIN) 650 MG CR tablet Take 650 mg by mouth 3 (three) times daily.   Yes Historical Provider, MD  alendronate (FOSAMAX) 70 MG tablet Take 70 mg by mouth once a week. Take with a full glass of water on an empty stomach.   Yes Historical Provider, MD  amLODipine (NORVASC) 10 MG tablet Take 10 mg by mouth daily.   Yes Historical Provider, MD  aspirin 325 MG tablet Take 325 mg by mouth daily.   Yes Historical Provider, MD  Calcium Carb-Cholecalciferol (CALCIUM 600 + D) 600-200 MG-UNIT TABS Take 1 tablet by mouth every morning.    Yes Historical Provider, MD  carboxymethylcellulose (REFRESH PLUS) 0.5 % SOLN Apply 1-2 drops to eye every morning.   Yes Historical Provider, MD  docusate sodium (COLACE) 100 MG capsule Take 100  mg by mouth every morning.   Yes Historical Provider, MD  donepezil (ARICEPT) 10 MG tablet Take 10 mg by mouth at bedtime.   Yes Historical Provider, MD  enoxaparin (LOVENOX) 30 MG/0.3ML injection Inject 0.3 mLs (30 mg total) into the skin every 12 (twelve) hours. 11/25/14 12/09/14 Yes Houston Siren, MD  lisinopril (PRINIVIL,ZESTRIL) 20 MG tablet Take 20 mg by mouth daily.   Yes Historical Provider, MD  loratadine (SM LORATADINE) 5 MG/5ML syrup Take 5 mLs by mouth daily as needed. For allergies.   Yes Historical Provider, MD  Multiple Vitamin (MULTIVITAMIN WITH MINERALS) TABS tablet Take 1 tablet by mouth daily.   Yes Historical  Provider, MD  simvastatin (ZOCOR) 10 MG tablet Take 10 mg by mouth daily.    Yes Historical Provider, MD  tolterodine (DETROL) 1 MG tablet Take 1 mg by mouth 2 (two) times daily. 09/29/14 09/29/15 Yes Historical Provider, MD  Vitamin D, Ergocalciferol, (DRISDOL) 50000 UNITS CAPS capsule Take 50,000 Units by mouth every 7 (seven) days.   Yes Historical Provider, MD    REVIEW OF SYSTEMS:  Review of Systems  Constitutional: Negative for fever, chills, weight loss and malaise/fatigue.  HENT: Negative for ear pain, hearing loss and tinnitus.   Eyes: Negative for blurred vision, double vision, pain and redness.  Respiratory: Negative for cough, hemoptysis and shortness of breath.   Cardiovascular: Negative for chest pain, palpitations, orthopnea and leg swelling.  Gastrointestinal: Negative for nausea, vomiting, abdominal pain, diarrhea and constipation.  Genitourinary: Negative for dysuria, frequency and hematuria.  Musculoskeletal: Negative for back pain, joint pain and neck pain.  Skin:       Significant bruising around her right upper arm, No acne or rash  Neurological: Negative for dizziness, tremors, focal weakness and weakness.  Endo/Heme/Allergies: Negative for polydipsia. Does not bruise/bleed easily.  Psychiatric/Behavioral: Negative for depression. The patient is not nervous/anxious and does not have insomnia.      VITAL SIGNS:   Filed Vitals:   11/30/14 1756 11/30/14 1837 11/30/14 1841  BP: 126/69 121/73 110/54  Pulse: 155 151 79  Temp: 98.1 F (36.7 C)    TempSrc: Oral    Resp: Height:  (1.6 m)    Weight: 68.04 kg (150 lb)    SpO2: 100% 97% 98%   Wt Readings from Last 3 Encounters:  11/30/14 68.04 kg (150 lb)  11/22/14 64.411 kg (142 lb)  03/26/14 69.809 kg (153 lb 14.4 oz)    PHYSICAL EXAMINATION:  Physical Exam  Constitutional: She is oriented to person, place, and time. She appears well-developed and well-nourished. No distress.  HENT:  Head:  Normocephalic and atraumatic.  Mouth/Throat: Oropharynx is clear and moist.  Eyes: Conjunctivae and EOM are normal. Pupils are equal, round, and reactive to light. No scleral icterus.  Neck: Normal range of motion. Neck supple. No JVD present. No thyromegaly present.  Cardiovascular: Intact distal pulses.  Exam reveals no gallop and no friction rub.   No murmur heard. Tachycardic  Respiratory: Effort normal and breath sounds normal. No respiratory distress. She has no wheezes. She has no rales.  GI: Soft. Bowel sounds are normal. She exhibits no distension. There is no tenderness.  Musculoskeletal: Normal range of motion. She exhibits no edema.  Right arm in sling, right leg status post repair  Lymphadenopathy:    She has no cervical adenopathy.  Neurological: She is alert and oriented to person, place, and time. No cranial nerve deficit.  No dysarthria,  no aphasia  Skin: Skin is warm and dry. No rash noted. No erythema.  Psychiatric: She has a normal mood and affect. Her behavior is normal. Judgment and thought content normal.    LABORATORY PANEL:   CBC  Recent Labs Lab 11/30/14 1805  WBC 9.6  HGB 9.9*  HCT 30.4*  PLT 267   ------------------------------------------------------------------------------------------------------------------  Chemistries   Recent Labs Lab 11/24/14 0538 11/30/14 1805  NA 138 138  K 3.2* 4.1  CL 107 105  CO2 24 26  GLUCOSE 144* 117*  BUN 12 26*  CREATININE 0.67 0.87  CALCIUM 8.6* 8.5*  MG 2.0  --    ------------------------------------------------------------------------------------------------------------------  Cardiac Enzymes  Recent Labs Lab 11/30/14 1805  TROPONINI 0.03   ------------------------------------------------------------------------------------------------------------------  RADIOLOGY:  No results found.  EKG:   Orders placed or performed during the hospital encounter of 11/22/14  . EKG 12-Lead  . EKG  12-Lead  . EKG    IMPRESSION AND PLAN:  Principal Problem:   Atrial fibrillation with RVR - patient responded to Cardizem, but her heart rate and then return to A. fib with RVR, she never converted from A. fib, so Cardizem drip was started. Patient is on therapeutic dose Lovenox for the same. It is unclear why she is not on an oral anticoagulant like Eliquis her Coumadin, and this will need to be addressed during her stay here. Active Problems:   Essential hypertension, benign - blood pressure stable at this time, we will continue to monitor while she is on a Cardizem drip, and can restart her home antihypertensives when needed, or after she is off the drip.   Closed right hip fracture - patient states she is not in any pain, we will get PT to work with her while she is here.   Arthritis - patient takes Tylenol as needed for this, we will order that here.   Dementia - continue home meds   Osteopenia - continue calcium and vitamin D replacement   Hyperlipidemia - continue home dose statin  All the records are reviewed and case discussed with ED provider. Management plans discussed with the patient and/or family.  DVT PROPHYLAXIS: Systemic anticoagulation  ADMISSION STATUS: Inpatient  CODE STATUS: DO NOT RESUSCITATE  TOTAL TIME TAKING CARE OF THIS PATIENT: 40 minutes.    Braedan Meuth FIELDING 11/30/2014, 8:36 PM  [facility]  Office  [phone]  CC: Primary care physician; Leim Fabry, MD

## 2014-11-30 NOTE — Progress Notes (Signed)
From prior fall, arm in sling

## 2014-11-30 NOTE — ED Notes (Signed)
Pt from [facility].  Per EMS:  Pt had a random VS check today and was found to be tachycardiac.  No other symptoms.

## 2014-11-30 NOTE — ED Notes (Signed)
Called and talked with Dr Anne HahnWillis, admitting MD.  He is aware that patients heart rate remains around150.  No additional orders at this time.

## 2014-12-01 LAB — BASIC METABOLIC PANEL
ANION GAP: 8 (ref 5–15)
BUN: 19 mg/dL (ref 6–20)
CALCIUM: 8 mg/dL — AB (ref 8.9–10.3)
CHLORIDE: 106 mmol/L (ref 101–111)
CO2: 26 mmol/L (ref 22–32)
CREATININE: 0.78 mg/dL (ref 0.44–1.00)
GFR calc Af Amer: >60 mL/min (ref >60)
GFR calc non Af Amer: >60 mL/min (ref >60)
GLUCOSE: 113 mg/dL — AB (ref 65–99)
POTASSIUM: 3.8 mmol/L (ref 3.5–5.1)
SODIUM: 140 mmol/L (ref 135–145)

## 2014-12-01 LAB — CBC
HCT: 26.2 % — ABNORMAL LOW (ref 35.0–47.0)
Hemoglobin: 8.9 g/dL — ABNORMAL LOW (ref 12.0–16.0)
MCH: 31 pg (ref 26.0–34.0)
MCHC: 34 g/dL (ref 32.0–36.0)
MCV: 91.3 fL (ref 80.0–100.0)
PLATELETS: 246 10*3/uL (ref 150–440)
RBC: 2.87 MIL/uL — AB (ref 3.80–5.20)
RDW: 14.8 % — AB (ref 11.5–14.5)
WBC: 7.8 10*3/uL (ref 3.6–11.0)

## 2014-12-01 LAB — HEPARIN LEVEL (UNFRACTIONATED): HEPARIN UNFRACTIONATED: 0.36 [IU]/mL (ref 0.30–0.70)

## 2014-12-01 LAB — TSH: TSH: 0.89 u[IU]/mL (ref 0.350–4.500)

## 2014-12-01 LAB — MRSA PCR SCREENING: MRSA by PCR: NEGATIVE

## 2014-12-01 MED ORDER — DILTIAZEM HCL ER COATED BEADS 120 MG PO CP24
120.0000 mg | ORAL_CAPSULE | Freq: Every day | ORAL | Status: DC
  Administered 2014-12-01 – 2014-12-02 (×2): 120 mg via ORAL
  Filled 2014-12-01 (×2): qty 1

## 2014-12-01 MED ORDER — DILTIAZEM HCL 30 MG PO TABS
30.0000 mg | ORAL_TABLET | Freq: Four times a day (QID) | ORAL | Status: DC
  Administered 2014-12-01: 30 mg via ORAL
  Filled 2014-12-01: qty 1

## 2014-12-01 MED ORDER — APIXABAN 5 MG PO TABS
5.0000 mg | ORAL_TABLET | Freq: Two times a day (BID) | ORAL | Status: DC
  Administered 2014-12-01 – 2014-12-02 (×3): 5 mg via ORAL
  Filled 2014-12-01 (×3): qty 1

## 2014-12-01 MED ORDER — ACETAMINOPHEN 325 MG PO TABS
650.0000 mg | ORAL_TABLET | Freq: Four times a day (QID) | ORAL | Status: DC | PRN
  Administered 2014-12-01: 650 mg via ORAL
  Filled 2014-12-01: qty 2

## 2014-12-01 MED ORDER — POLYVINYL ALCOHOL 1.4 % OP SOLN
1.0000 [drp] | OPHTHALMIC | Status: DC | PRN
  Filled 2014-12-01: qty 15

## 2014-12-01 NOTE — Progress Notes (Signed)
[facility], pt was placed on remote telemetry and transferred via bed.

## 2014-12-01 NOTE — Progress Notes (Signed)
Spoke with Dr. Anne HahnWillis concerning lovenox order.  Per MD, do not give lovenox, continue with heparin drip.

## 2014-12-01 NOTE — Care Management Note (Signed)
Case Management Note  Patient Details  Name: Doreen Salvagestelle W Teasdale MRN: [id number] Date of Birth: 07/29/1931  Subjective/Objective: Pres[facility] with atrial fibrillation and RVR. [facility]-11/25/2014 for right femur and humerus fracture and repair and discharged to SNF. On Cardizem gtt and converting to PO Cardizem. Following progress. CSW referral initiated.                   Action/Plan:   Expected Discharge Date:                  Expected Discharge Plan:  Skilled Nursing Facility  In-House Referral:  Clinical Social Work  Discharge planning Services  CM Consult  Post Acute Care Choice:    Choice offered to:     DME Arranged:    DME Agency:     HH Arranged:    HH Agency:     Status of Service:  In process, will continue to follow  Medicare Important Message Given:    Date Medicare IM Given:    Medicare IM give by:    Date Additional Medicare IM Given:    Additional Medicare Important Message give by:     If discussed at Long Length of Stay Meetings, dates discussed:    Additional Comments:  Marily MemosLisa M Sanuel Ladnier, RN 12/01/2014, 11:01 AM

## 2014-12-01 NOTE — Progress Notes (Signed)
Pt was receptive to PO Cardizem and was titrated off of the Cardizem drip. HR was slighly bradycardic for a short period, Dr. Winona LegatoVaickute advised to hold Cardizem until her HR became normal again. Pt uses a bedpan, no complaints at this time.

## 2014-12-01 NOTE — Consult Note (Signed)
[facility] CLINIC CARDIOLOGY [facility]  CARDIOLOGY CONSULT NOTE  Patient ID: Rebecca Reed MRN: [id number] DOB/AGE: 79/14/1933 79 y.o.  Admit date: 11/30/2014 Referring Physician Dr. Winona Legato Primary Physician Dr. Dayna Barker Primary Cardiologist Dr. Lady Gary Reason for Consultation Afib  HPI:  81 y.o. female who presents with A. fib with RVR. Patient was recently admitted here for femur repair and humeral fracture, and was discharged to a rehabilitation facility. She was having her vital signs checked today when she was found to have significant tachycardia, and her blood pressure was significantly elevated. EKG in er revealed probable atrial fib/flutter with intermittent rvr to rates of 150. Pt was asymptomatic with this and hemodynamically stable. She was on lovenox 30 mg daily sq for prophylaxis. She was given iv cardizem boluses in the er with some rate improvement. She was admitted to icu and placed on iv heparin. Pt has dementia and is a somewhat diffuse historian but denies chest pain.  She had a negative functional study preop and has ruled out for an mi. Pt had a mechanical fall causing her fractures. Her CHADS 2 VaSc score is 3. No absolute contraindication for NOAC anticoagulation. Non valvular afib. Renal function normal. Will add Eliquis at 5 mg q 12 hours. Will need to closely follow for further falls. Will convert her to oral cardizem   ROS Review of Systems - History obtained from chart review and the patient General ROS: positive for  - right hip anad arm pain post fracture Respiratory ROS: no cough, shortness of breath, or wheezing Cardiovascular ROS: positive for - rapid heart rate Gastrointestinal ROS: no abdominal pain, change in bowel habits, or black or bloody stools Musculoskeletal ROS: positive for - pain in arm - right, upper and hip - right Neurological ROS: no TIA or stroke symptoms   Past Medical History  Diagnosis Date  . HTN (hypertension)   .  Thrombocytopenia   . Dementia   . Hyperlipidemia   . Low back pain     Chronic  . T11 vertebral fracture     01/14/2014  . Arthritis   . Atrial fibrillation and flutter   . Osteopenia   . Complete left bundle branch block 11/30/2014    Family History  Problem Relation Age of Onset  . Alzheimer's disease Mother   . Alzheimer's disease Sister     History   Social History  . Marital Status: Married    Spouse Name: N/A  . Number of Children: N/A  . Years of Education: N/A   Occupational History  . Not on file.   Social History Main Topics  . Smoking status: Never Smoker   . Smokeless tobacco: Not on file  . Alcohol Use: No  . Drug Use: No  . Sexual Activity: Not on file   Other Topics Concern  . Not on file   Social History Narrative    Past Surgical History  Procedure Laterality Date  . Total knee arthroplasty      bil    . Cholecystectomy    . Abdominal hysterectomy    . Kyphoplasty N/A 03/19/2014    Procedure: Thoracic twelve KYPHOPLASTY;  Surgeon: Maeola Harman, MD;  Location: [facility];  Service: Neurosurgery;  Laterality: N/A;  . Hip arthroplasty Right 11/22/2014    Procedure: ARTHROPLASTY BIPOLAR HIP (HEMIARTHROPLASTY);  Surgeon: Kyra Searles, MD;  Location: [facility] ORS;  Service: Orthopedics;  Laterality: Right;     Prescriptions prior to admission  Medication Sig Dispense Refill  Last Dose  . acetaminophen (TYLENOL ARTHRITIS PAIN) 650 MG CR tablet Take 650 mg by mouth 3 (three) times daily.   11/30/2014 at Unknown time  . alendronate (FOSAMAX) 70 MG tablet Take 70 mg by mouth once a week. Take with a full glass of water on an empty stomach.   11/28/2014  . amLODipine (NORVASC) 10 MG tablet Take 10 mg by mouth daily.   11/30/2014 at Unknown time  . aspirin 325 MG tablet Take 325 mg by mouth daily.   11/30/2014 at Unknown time  . Calcium Carb-Cholecalciferol (CALCIUM 600 + D) 600-200 MG-UNIT TABS Take 1 tablet by mouth every morning.    11/30/2014 at Unknown time   . carboxymethylcellulose (REFRESH PLUS) 0.5 % SOLN Apply 1-2 drops to eye every morning.   11/30/2014 at Unknown time  . docusate sodium (COLACE) 100 MG capsule Take 100 mg by mouth every morning.   11/30/2014 at Unknown time  . donepezil (ARICEPT) 10 MG tablet Take 10 mg by mouth at bedtime.   11/29/2014 at Unknown time  . enoxaparin (LOVENOX) 30 MG/0.3ML injection Inject 0.3 mLs (30 mg total) into the skin every 12 (twelve) hours. 0 Syringe  11/30/2014 at Unknown time  . lisinopril (PRINIVIL,ZESTRIL) 20 MG tablet Take 20 mg by mouth daily.   11/29/2014 at Unknown time  . loratadine (SM LORATADINE) 5 MG/5ML syrup Take 5 mLs by mouth daily as needed. For allergies.   unknown  . Multiple Vitamin (MULTIVITAMIN WITH MINERALS) TABS tablet Take 1 tablet by mouth daily.   11/30/2014 at Unknown time  . simvastatin (ZOCOR) 10 MG tablet Take 10 mg by mouth daily.    11/29/2014 at Unknown time  . tolterodine (DETROL) 1 MG tablet Take 1 mg by mouth 2 (two) times daily.   11/30/2014 at Unknown time  . Vitamin D, Ergocalciferol, (DRISDOL) 50000 UNITS CAPS capsule Take 50,000 Units by mouth every 7 (seven) days.   11/29/2014    Physical Exam: Blood pressure 103/56, pulse 74, temperature 97.9 F (36.6 C), temperature source Oral, resp. rate 19, height 5\' 3"  (1.6 m), weight 69.5 kg (153 lb 3.5 oz), SpO2 96 %.    General appearance: cooperative and slowed mentation Head: Normocephalic, without obvious abnormality Resp: clear to auscultation bilaterally Cardio: irregularly irregular rhythm GI: soft, non-tender; bowel sounds normal; no masses,  no organomegaly Extremities: extremities normal, atraumatic, no cyanosis or edema Pulses: 2+ and symmetric Neurologic: Mental status: orientation: person, place Labs:   Lab Results  Component Value Date   WBC 7.8 12/01/2014   HGB 8.9* 12/01/2014   HCT 26.2* 12/01/2014   MCV 91.3 12/01/2014   PLT 246 12/01/2014    Recent Labs Lab 12/01/14 0608  NA 140  K 3.8  CL  106  CO2 26  BUN 19  CREATININE 0.78  CALCIUM 8.0*  GLUCOSE 113*   Lab Results  Component Value Date   TROPONINI 0.03 11/30/2014      Radiology:  EKG: Afib/flutter with variable vr  ASSESSMENT AND PLAN:  Pt with right hip and humeral fracture now with post op afib. Appears to be post op etiology. Was on lovenox for prophylaxis. Will change from cardizem iv to po and start eliquis 5 bid and stop heparin. If he remains stable with transfer to telemetry. Follow on telemetry and consider discharge in 24 hours if stable.  Signed: Dalia HeadingFATH,KENNETH A. MD, [facility] 12/01/2014, 7:55 AM

## 2014-12-01 NOTE — Progress Notes (Signed)
[facility] [place] [facility]   PATIENT NAME: Rebecca Reed    MR#:  [id number]  DATE OF BIRTH:  06/15/1931  SUBJECTIVE:  CHIEF COMPLAINT:   Chief Complaint  Patient presents with  . Tachycardia   patient presents with A. fib, RVR, no complaints of chest pains or significant shortness of breath. Ruled out of MI. Started on Cardizem IV drip, today in the morning. However, she was given short-acting Cardizem now. She still remains in A. fib. However, her heart rate is in the 50s to 60s. Dr. Lady Gary saw patient in consultation and recommended to continue therapy with Cardizem and Eliquis  Review of Systems  Unable to perform ROS: dementia    VITAL SIGNS: Blood pressure 106/42, pulse 60, temperature 97.9 F (36.6 C), temperature source Oral, resp. rate 17, height  (1.6 m), weight 69.5 kg (153 lb 3.5 oz), SpO2 97 %.  PHYSICAL EXAMINATION:   GENERAL:  81 y.o.-year-old patient lying in the bed with no acute distress.  EYES: Pupils equal, round, reactive to light and accommodation. No scleral icterus. Extraocular muscles intact.  HEENT: Head atraumatic, normocephalic. Oropharynx and nasopharynx clear.  NECK:  Supple, no jugular venous distention. No thyroid enlargement, no tenderness.  LUNGS: Normal breath sounds bilaterally, no wheezing, rales,rhonchi or crepitation. No use of accessory muscles of respiration.  CARDIOVASCULAR: S1, S2 normal. No murmurs, rubs, or gallops.  ABDOMEN: Soft, nontender, nondistended. Bowel sounds present. No organomegaly or mass.  EXTREMITIES: No pedal edema, cyanosis, or clubbing.  NEUROLOGIC: Cranial nerves II through XII are intact. Muscle strength 5/5 in all extremities. Sensation intact. Gait not checked.  PSYCHIATRIC: The patient is alert and oriented x 3.  SKIN: No obvious rash, lesion, or ulcer.   ORDERS/RESULTS REVIEWED:   CBC  Recent Labs Lab 11/30/14 1805 12/01/14 0608  WBC 9.6 7.8  HGB 9.9* 8.9*  HCT  30.4* 26.2*  PLT 267 246  MCV 91.2 91.3  MCH 29.8 31.0  MCHC 32.6 34.0  RDW 14.8* 14.8*  LYMPHSABS 2.0  --   MONOABS 1.1*  --   EOSABS 0.0  --   BASOSABS 0.1  --    ------------------------------------------------------------------------------------------------------------------  Chemistries   Recent Labs Lab 11/30/14 1805 12/01/14 0608  NA 138 140  K 4.1 3.8  CL 105 106  CO2 26 26  GLUCOSE 117* 113*  BUN 26* 19  CREATININE 0.87 0.78  CALCIUM 8.5* 8.0*   ------------------------------------------------------------------------------------------------------------------ estimated creatinine clearance is 51.5 mL/min (by C-G formula based on Cr of 0.78). ------------------------------------------------------------------------------------------------------------------ No results for input(s): TSH, T4TOTAL, T3FREE, THYROIDAB in the last 72 hours.  Invalid input(s): FREET3  Cardiac Enzymes  Recent Labs Lab 11/30/14 1805  TROPONINI 0.03   ------------------------------------------------------------------------------------------------------------------ Invalid input(s): POCBNP ---------------------------------------------------------------------------------------------------------------  RADIOLOGY: No results found.  EKG:  Orders placed or performed during the hospital encounter of 11/30/14  . EKG 12-Lead  . EKG 12-Lead  . EKG 12-Lead  . EKG 12-Lead  . EKG 12-Lead  . EKG 12-Lead    ASSESSMENT AND PLAN:  Principal Problem:   Atrial fibrillation with RVR Active Problems:   Essential hypertension, benign   Closed right hip fracture   Dementia   Osteopenia   Hyperlipidemia   Arthritis   Atrial fibrillation with rapid ventricular response 1. A. fib, RVR, discontinue Cardizem IV drip as the patient on Cardizem CD at low dose at 120 mg daily. Start patient on Eliquis, get care management involved to discuss this patient's husband Eliquis costs 2. Essential  hypertension, well controlled with Cardizem 3. Anemia. Get guaiac 4. History of closed right hip fracture. Subsequent encounter. Continue anticoagulation and physical therapy in the debilitation facility where we will likely discharge tomorrow   Management plans discussed with the patient, family and they are in agreement.   DRUG ALLERGIES:  Allergies  Allergen Reactions  . Other Anaphylaxis    Uncoded Allergy. Allergen: THALLIUM    CODE STATUS:     Code Status Orders        Start     Ordered   11/30/14 2253  Do not attempt resuscitation (DNR)   Continuous    Question Answer Comment  In the event of cardiac or respiratory ARREST Do not call a "code blue"   In the event of cardiac or respiratory ARREST Do not perform Intubation, CPR, defibrillation or ACLS   In the event of cardiac or respiratory ARREST Use medication by any route, position, wound care, and other measures to relive pain and suffering. May use oxygen, suction and manual treatment of airway obstruction as needed for comfort.      11/30/14 2252      TOTAL TIME TAKING CARE OF THIS PATIENT: 45 minutes.  Prolonged discussion this patient's husband, Mr. Rebecca Reed in regards to medical therapy, treatment plan and discharge planning. All questions answered, he voiced understanding and agreement. Time spent approximately 10 minutes. Additionally  Rebecca Reed M.D on 12/01/2014 at 12:46 PM  Between 7am to 6pm - Pager - [phone]  After 6pm go to [email or web address] - password [email or web address] [facility]  [facility]  Office  [phone]  CC: Primary care physician; Rebecca FabryALDRIDGE,BARBARA, MD

## 2014-12-01 NOTE — Clinical Social Work Note (Signed)
Clinical Social Work Assessment  Patient Details  Name: Rebecca Reed MRN: [id number] Date of Birth: 11/14/1931  Date of referral:  12/01/14               Reason for consult:  Facility Placement                Permission sought to share information with:    Permission granted to share information::     Name::        Agency::     Relationship::     Contact Information:     Housing/Transportation Living arrangements for the past 2 months:  [facility]illed [facility]urs[facility] Facility Source of Information:  Patient Patient Interpreter Needed:  None Criminal Activity/Legal Involvement Pertinent to Current Situation/Hospitalization:  No - Comment as needed Significant Relationships:  Spouse Lives with:  Facility Resident Do you feel safe going back to the place where you live?  Yes Need for family participation in patient care:  No (Coment)  Care giving concerns:  [facility]   Social Worker assessment / plan:  CSW [facility] with [facility] and she stated that patient is a campus resident and that she was in the independent homes and has been over at their healthcare building for rehab. Patient can return at discharge. CSW spoke with patient briefly this afternoon. Patient was slightly confused and thought her husband would have to have a key to visit her. She was aware she was in the hospital but not sure where in the hospital as they had just recently moved her from ICU. CSW reoriented patient and answered patient's questions. Patient [facility]. Patient states she has a husband and her daughter visits regularly.  Employment status:  Retired Health and safety inspectornsurance information:  Medicare PT Recommendations:  Not assessed at this time Information / Referral to community resources:     Patient/Family's Response to care:  Patient appreciative for CSW assistance.  Patient/Family's Understanding of and Emotional Response to Diagnosis, Current  Treatment, and Prognosis:  Patient seemed a little disoriented this afternoon but this could be due to her dementia and having been recently moved out of ICU to telemetry.  Emotional Assessment Appearance:  Appears stated age Attitude/Demeanor/Rapport:   (pleasant and cooperative) Affect (typically observed):  Accepting, Appropriate Orientation:  Oriented to Self, Oriented to Place, Oriented to Situation Alcohol / Substance use:  Not Applicable Psych involvement (Current and /or in the community):  No (Comment)  Discharge Needs  Concerns to be addressed:  No discharge needs identified Readmission within the last 30 days:  No Current discharge risk:  None Barriers to Discharge:  No Barriers Identified   York SpanielMonica Makenize Messman, LCSW 12/01/2014, 3:07 PM

## 2014-12-01 NOTE — Care Management Note (Signed)
Case Management Note  Patient Details  Name: Rebecca Reed MRN: [id number] Date of Birth: 10/09/1931  Subjective/Objective:   Plan is for [facility] tot [facility].30 day free trial card given for Eliquis.                  Action/Plan:   Expected Discharge Date:                  Expected Discharge Plan:  Skilled Nursing Facility  In-House Referral:  Clinical Social Work  Discharge planning Services  CM Consult  Post Acute Care Choice:    Choice offered to:     DME Arranged:    DME Agency:     HH Arranged:    HH Agency:     Status of Service:  In process, will continue to follow  Medicare Important Message Given:    Date Medicare IM Given:    Medicare IM give by:    Date Additional Medicare IM Given:    Additional Medicare Important Message give by:     If discussed at Long Length of Stay Meetings, dates discussed:    Additional Comments:  Rebecca MemosLisa M Arrabella Westerman, RN 12/01/2014, 2:15 PM

## 2014-12-02 DIAGNOSIS — D649 Anemia, unspecified: Secondary | ICD-10-CM

## 2014-12-02 LAB — IRON AND TIBC
IRON: 49 ug/dL (ref 28–170)
Saturation Ratios: 17 % (ref 10.4–31.8)
TIBC: 285 ug/dL (ref 250–450)
UIBC: 236 ug/dL

## 2014-12-02 LAB — FERRITIN: Ferritin: 102 ng/mL (ref 11–307)

## 2014-12-02 MED ORDER — APIXABAN 5 MG PO TABS: 5.0000 mg | ORAL_TABLET | Freq: Two times a day (BID) | ORAL | Status: DC

## 2014-12-02 MED ORDER — DILTIAZEM HCL ER COATED BEADS 120 MG PO CP24: 120.0000 mg | ORAL_CAPSULE | Freq: Every day | ORAL | Status: DC

## 2014-12-02 NOTE — Progress Notes (Signed)
Pt to be discharged to [facility] for rehab. Iv and tele removed. Report called to facility. Pt to be transported by ems. Husband at bedside.

## 2014-12-02 NOTE — Care Management Important Message (Signed)
Important Message  Patient Details  Name: Rebecca Reed MRN: [id number] Date of Birth: 09/01/1931   Medicare Important Message Given:  Yes-third notification given    Olegario MessierKathy A Allmond 12/02/2014, 9:44 AM

## 2014-12-02 NOTE — Discharge Summary (Signed)
[facility] - [place] [facility]   PATIENT NAME: Rebecca Reed    MR#:  [id number]  DATE OF BIRTH:  1931-12-01  DATE OF ADMISSION:  11/30/2014 ADMITTING PHYSICIAN: Oralia Manis, MD  DATE OF DISCHARGE: No discharge date for patient encounter.  PRIMARY CARE PHYSICIAN: ALDRIDGE,BARBARA, MD     ADMISSION DIAGNOSIS:  Atrial flutter, unspecified [I48.92]  DISCHARGE DIAGNOSIS:  Principal Problem:   Atrial fibrillation with RVR Active Problems:   Atrial fibrillation with rapid ventricular response   Essential hypertension, benign   Closed right hip fracture   Dementia   Osteopenia   Hyperlipidemia   Arthritis   Anemia   SECONDARY DIAGNOSIS:   Past Medical History  Diagnosis Date  . HTN (hypertension)   . Thrombocytopenia   . Dementia   . Hyperlipidemia   . Low back pain     Chronic  . T11 vertebral fracture     01/14/2014  . Arthritis   . Atrial fibrillation and flutter   . Osteopenia   . Complete left bundle branch block 11/30/2014    .pro HOSPITAL COURSE:   Patient is 81 year old Caucasian female with past history of essential hypertension, dementia , recent hip fracture status post repair results to the hospital with complaints of tachycardia and was noted to have A. fib, RVR, initial heart rate of 150. Patient was largely asymptomatic. She denied any significant shortness of breath or chest pains. Her labs were unremarkable except of low potassium levels at 3.2 -3.3, she was given a few doses of IV Cardizem in the emergency room with no significant improvement and then she was started on IV Cardizem drip. This heart rate improved and she was switched to  long-acting Cardizem, cartilage. A cardiology consultation was obtained and patient was initiated on Eliquis. By 12/02/2014 patient converted back to sinus rhythm.  Discussion by problem 1. A. fib, RVR, converted to sinus rhythm and now , continue Cardizem CD at 120 mg daily, advance it as  needed to control heart rate as well as blood pressure , Heart rate in 80s today. Continue patient on Eliquis, discharge to skilled nursing facility for rehabilitation today  2. Essential hypertension, well controlled with Cardizem CD 3. Anemia. Ordered guaiac, not received,  although patient had a bowel movement earlier today 4. History of closed right hip fracture. Subsequent encounter. Continue anticoagulation and physical therapy in the rehabilitation facility where we are discharging patient today  DISCHARGE CONDITIONS:   Fair  CONSULTS OBTAINED:  Treatment Team:  Dalia Heading, MD  DRUG ALLERGIES:   Allergies  Allergen Reactions  . Other Anaphylaxis    Uncoded Allergy. Allergen: THALLIUM    DISCHARGE MEDICATIONS:   Current Discharge Medication List    START taking these medications   Details  apixaban (ELIQUIS) 5 MG TABS tablet Take 1 tablet (5 mg total) by mouth 2 (two) times daily. Qty: 60 tablet, Refills: 3    diltiazem (CARDIZEM CD) 120 MG 24 hr capsule Take 1 capsule (120 mg total) by mouth daily. Qty: 30 capsule, Refills: 3      CONTINUE these medications which have NOT CHANGED   Details  acetaminophen (TYLENOL ARTHRITIS PAIN) 650 MG CR tablet Take 650 mg by mouth 3 (three) times daily.    alendronate (FOSAMAX) 70 MG tablet Take 70 mg by mouth once a week. Take with a full glass of water on an empty stomach.    Calcium Carb-Cholecalciferol (CALCIUM 600 + D) 600-200 MG-UNIT TABS Take 1  tablet by mouth every morning.     carboxymethylcellulose (REFRESH PLUS) 0.5 % SOLN Apply 1-2 drops to eye every morning.    docusate sodium (COLACE) 100 MG capsule Take 100 mg by mouth every morning.    donepezil (ARICEPT) 10 MG tablet Take 10 mg by mouth at bedtime.    loratadine (SM LORATADINE) 5 MG/5ML syrup Take 5 mLs by mouth daily as needed. For allergies.    Multiple Vitamin (MULTIVITAMIN WITH MINERALS) TABS tablet Take 1 tablet by mouth daily.    simvastatin  (ZOCOR) 10 MG tablet Take 10 mg by mouth daily.     tolterodine (DETROL) 1 MG tablet Take 1 mg by mouth 2 (two) times daily.    Vitamin D, Ergocalciferol, (DRISDOL) 50000 UNITS CAPS capsule Take 50,000 Units by mouth every 7 (seven) days.      STOP taking these medications     amLODipine (NORVASC) 10 MG tablet      aspirin 325 MG tablet      enoxaparin (LOVENOX) 30 MG/0.3ML injection      lisinopril (PRINIVIL,ZESTRIL) 20 MG tablet          DISCHARGE INSTRUCTIONS:    Follow-up with primary care physician, Dr. Dayna Barker,  cardiologist, Dr. Lady Gary in the next few weeks after discharge, follow closely blood pressure as well as heart rate and make decisions about advancement of Cardizem CD as needed.   If you experience worsening of your admission symptoms, develop shortness of breath, life threatening emergency, suicidal or homicidal thoughts you must seek medical attention immediately by calling 911 or calling your MD immediately  if symptoms less severe.  You Must read complete instructions/literature along with all the possible adverse reactions/side effects for all the Medicines you take and that have been prescribed to you. Take any new Medicines after you have completely understood and accept all the possible adverse reactions/side effects.   Please note  You were cared for by a hospitalist during your hospital stay. If you have any questions about your discharge medications or the care you received while you were in the hospital after you are discharged, you can call the unit and asked to speak with the hospitalist on call if the hospitalist that took care of you is not available. Once you are discharged, your primary care physician will handle any further medical issues. Please note that NO REFILLS for any discharge medications will be authorized once you are discharged, as it is imperative that you return to your primary care physician (or establish a relationship with a primary  care physician if you do not have one) for your aftercare needs so that they can reassess your need for medications and monitor your lab values.    Today   CHIEF COMPLAINT:   Chief Complaint  Patient presents with  . Tachycardia    HISTORY OF PRESENT ILLNESS:  Rebecca Reed  is a 81 y.o. female with a known history of essential hypertension, dementia , recent hip fracture status post repair results to the hospital with complaints of tachycardia and was noted to have A. fib, RVR, initial heart rate of 150. Patient was largely asymptomatic. She denied any significant shortness of breath or chest pains. Her labs were unremarkable except of low potassium levels at 3.2 -3.3, she was given a few doses of IV Cardizem in the emergency room with no significant improvement and then she was started on IV Cardizem drip. This heart rate improved and she was switched to  long-acting Cardizem, cartilage.  A cardiology consultation was obtained and patient was initiated on Eliquis. By 12/02/2014 patient converted back to sinus rhythm.  Discussion by problem 1. A. fib, RVR, converted to sinus rhythm and now , continue Cardizem CD at 120 mg daily, advance it as needed to control heart rate as well as blood pressure , Heart rate in 80s today. Continue patient on Eliquis, discharge to skilled nursing facility for rehabilitation today  2. Essential hypertension, well controlled with Cardizem CD 3. Anemia. Ordered guaiac, not received,  although patient had a bowel movement earlier today 4. History of closed right hip fracture. Subsequent encounter. Continue anticoagulation and physical therapy in the rehabilitation facility where we are discharging patient today     VITAL SIGNS:  Blood pressure 126/50, pulse 70, temperature 97.8 F (36.6 C), temperature source Oral, resp. rate 20, height 5\' 3"  (1.6 m), weight 69.264 kg (152 lb 11.2 oz), SpO2 97 %.  I/O:   Intake/Output Summary (Last 24 hours) at 12/02/14  1155 Last data filed at 12/02/14 1000  Gross per 24 hour  Intake 281.25 ml  Output    351 ml  Net -69.75 ml    PHYSICAL EXAMINATION:  GENERAL:  81 y.o.-year-old patient lying in the bed with no acute distress.  EYES: Pupils equal, round, reactive to light and accommodation. No scleral icterus. Extraocular muscles intact.  HEENT: Head atraumatic, normocephalic. Oropharynx and nasopharynx clear.  NECK:  Supple, no jugular venous distention. No thyroid enlargement, no tenderness.  LUNGS: Normal breath sounds bilaterally, no wheezing, rales,rhonchi or crepitation. No use of accessory muscles of respiration.  CARDIOVASCULAR: S1, S2 normal. No murmurs, rubs, or gallops.  ABDOMEN: Soft, non-tender, non-distended. Bowel sounds present. No organomegaly or mass.  EXTREMITIES: No pedal edema, cyanosis, or clubbing.  NEUROLOGIC: Cranial nerves II through XII are intact. Muscle strength 5/5 in all extremities. Sensation intact. Gait not checked.  PSYCHIATRIC: The patient is alert and oriented x 3.  SKIN: No obvious rash, lesion, or ulcer.   DATA REVIEW:   CBC  Recent Labs Lab 12/01/14 0608  WBC 7.8  HGB 8.9*  HCT 26.2*  PLT 246    Chemistries   Recent Labs Lab 12/01/14 0608  NA 140  K 3.8  CL 106  CO2 26  GLUCOSE 113*  BUN 19  CREATININE 0.78  CALCIUM 8.0*    Cardiac Enzymes  Recent Labs Lab 11/30/14 1805  TROPONINI 0.03    Microbiology Results  Results for orders placed or performed during the hospital encounter of 11/30/14  MRSA PCR Screening     Status: None   Collection Time: 11/30/14  5:53 PM  Result Value Ref Range Status   MRSA by PCR NEGATIVE NEGATIVE Final    Comment:        The GeneXpert MRSA Assay (FDA approved for NASAL specimens only), is one component of a comprehensive MRSA colonization surveillance program. It is not intended to diagnose MRSA infection nor to guide or monitor treatment for MRSA infections.     RADIOLOGY:  No results  found.  EKG:   Orders placed or performed during the hospital encounter of 11/30/14  . EKG 12-Lead  . EKG 12-Lead  . EKG 12-Lead  . EKG 12-Lead  . EKG 12-Lead  . EKG 12-Lead      Management plans discussed with the patient, family and they are in agreement.  CODE STATUS:     Code Status Orders        Start     Ordered  11/30/14 2253  Do not attempt resuscitation (DNR)   Continuous    Question Answer Comment  In the event of cardiac or respiratory ARREST Do not call a "code blue"   In the event of cardiac or respiratory ARREST Do not perform Intubation, CPR, defibrillation or ACLS   In the event of cardiac or respiratory ARREST Use medication by any route, position, wound care, and other measures to relive pain and suffering. May use oxygen, suction and manual treatment of airway obstruction as needed for comfort.      11/30/14 2252      TOTAL TIME TAKING CARE OF THIS PATIENT: 40 minutes.    Katharina Caper M.D on 12/02/2014 at 11:55 AM  Between 7am to 6pm - Pager - [phone]  After 6pm go to [email or web address] - password [email or web address] [facility]  [place] [facility]  Office  [phone]  CC: Primary care physician; Leim Fabry, MD

## 2014-12-03 DIAGNOSIS — I4891 Unspecified atrial fibrillation: Secondary | ICD-10-CM | POA: Diagnosis not present

## 2014-12-23 DIAGNOSIS — S42201A Unspecified fracture of upper end of right humerus, initial encounter for closed fracture: Secondary | ICD-10-CM

## 2014-12-23 DIAGNOSIS — I480 Paroxysmal atrial fibrillation: Secondary | ICD-10-CM | POA: Diagnosis not present

## 2014-12-23 DIAGNOSIS — I10 Essential (primary) hypertension: Secondary | ICD-10-CM

## 2014-12-23 DIAGNOSIS — N3941 Urge incontinence: Secondary | ICD-10-CM

## 2014-12-23 DIAGNOSIS — G309 Alzheimer's disease, unspecified: Secondary | ICD-10-CM

## 2015-01-26 ENCOUNTER — Other Ambulatory Visit: Payer: Self-pay | Admitting: Orthopedic Surgery

## 2015-02-22 ENCOUNTER — Other Ambulatory Visit: Payer: Self-pay | Admitting: Surgery

## 2015-02-22 DIAGNOSIS — S32010A Wedge compression fracture of first lumbar vertebra, initial encounter for closed fracture: Secondary | ICD-10-CM

## 2015-02-22 DIAGNOSIS — M545 Low back pain: Secondary | ICD-10-CM

## 2015-02-26 ENCOUNTER — Ambulatory Visit
Admission: RE | Admit: 2015-02-26 | Discharge: 2015-02-26 | Disposition: A | Discharge status: Home / Self Care | Payer: Medicare Other | Source: Ambulatory Visit | Attending: Surgery | Admitting: Surgery

## 2015-02-26 DIAGNOSIS — M545 Low back pain: Secondary | ICD-10-CM | POA: Diagnosis present

## 2015-02-26 DIAGNOSIS — M4856XA Collapsed vertebra, not elsewhere classified, lumbar region, initial encounter for fracture: Secondary | ICD-10-CM | POA: Insufficient documentation

## 2015-02-26 DIAGNOSIS — M4854XA Collapsed vertebra, not elsewhere classified, thoracic region, initial encounter for fracture: Secondary | ICD-10-CM | POA: Diagnosis not present

## 2015-02-26 DIAGNOSIS — S32010A Wedge compression fracture of first lumbar vertebra, initial encounter for closed fracture: Secondary | ICD-10-CM

## 2015-02-26 DIAGNOSIS — M4806 Spinal stenosis, lumbar region: Secondary | ICD-10-CM | POA: Insufficient documentation

## 2016-04-28 ENCOUNTER — Emergency Department: Payer: Medicare Other

## 2016-04-28 ENCOUNTER — Observation Stay
Admission: EM | Admit: 2016-04-28 | Discharge: 2016-04-30 | Disposition: A | Discharge status: Skilled Nursing Facility | Payer: Medicare Other | Attending: Internal Medicine | Admitting: Internal Medicine

## 2016-04-28 ENCOUNTER — Encounter: Payer: Self-pay | Admitting: Internal Medicine

## 2016-04-28 DIAGNOSIS — I5022 Chronic systolic (congestive) heart failure: Secondary | ICD-10-CM | POA: Insufficient documentation

## 2016-04-28 DIAGNOSIS — I4892 Unspecified atrial flutter: Principal | ICD-10-CM | POA: Insufficient documentation

## 2016-04-28 DIAGNOSIS — I482 Chronic atrial fibrillation: Secondary | ICD-10-CM | POA: Diagnosis not present

## 2016-04-28 DIAGNOSIS — R350 Frequency of micturition: Secondary | ICD-10-CM | POA: Insufficient documentation

## 2016-04-28 DIAGNOSIS — Z66 Do not resuscitate: Secondary | ICD-10-CM | POA: Insufficient documentation

## 2016-04-28 DIAGNOSIS — M545 Low back pain: Secondary | ICD-10-CM | POA: Insufficient documentation

## 2016-04-28 DIAGNOSIS — F0280 Dementia in other diseases classified elsewhere without behavioral disturbance: Secondary | ICD-10-CM | POA: Insufficient documentation

## 2016-04-28 DIAGNOSIS — Z96653 Presence of artificial knee joint, bilateral: Secondary | ICD-10-CM | POA: Diagnosis not present

## 2016-04-28 DIAGNOSIS — E785 Hyperlipidemia, unspecified: Secondary | ICD-10-CM | POA: Diagnosis present

## 2016-04-28 DIAGNOSIS — E876 Hypokalemia: Secondary | ICD-10-CM | POA: Diagnosis not present

## 2016-04-28 DIAGNOSIS — I110 Hypertensive heart disease with heart failure: Secondary | ICD-10-CM | POA: Diagnosis not present

## 2016-04-28 DIAGNOSIS — R0789 Other chest pain: Secondary | ICD-10-CM | POA: Diagnosis not present

## 2016-04-28 DIAGNOSIS — F0390 Unspecified dementia without behavioral disturbance: Secondary | ICD-10-CM | POA: Diagnosis present

## 2016-04-28 DIAGNOSIS — R079 Chest pain, unspecified: Secondary | ICD-10-CM

## 2016-04-28 LAB — URINALYSIS, COMPLETE (UACMP) WITH MICROSCOPIC
BACTERIA UA: NONE SEEN
Bilirubin Urine: NEGATIVE
Glucose, UA: 50 mg/dL — AB
Hgb urine dipstick: NEGATIVE
Ketones, ur: NEGATIVE mg/dL
Leukocytes, UA: NEGATIVE
Nitrite: NEGATIVE
PROTEIN: 30 mg/dL — AB
RBC / HPF: NONE SEEN RBC/hpf (ref 0–5)
Specific Gravity, Urine: 1.004 — ABNORMAL LOW (ref 1.005–1.030)
WBC UA: NONE SEEN WBC/hpf (ref 0–5)
pH: 8 (ref 5.0–8.0)

## 2016-04-28 LAB — CBC WITH DIFFERENTIAL/PLATELET
BASOS ABS: 0 10*3/uL (ref 0–0.1)
BASOS PCT: 1 %
EOS PCT: 3 %
Eosinophils Absolute: 0.1 10*3/uL (ref 0–0.7)
HCT: 40 % (ref 35.0–47.0)
Hemoglobin: 13.7 g/dL (ref 12.0–16.0)
Lymphocytes Relative: 24 %
Lymphs Abs: 1.2 10*3/uL (ref 1.0–3.6)
MCH: 29.9 pg (ref 26.0–34.0)
MCHC: 34.2 g/dL (ref 32.0–36.0)
MCV: 87.4 fL (ref 80.0–100.0)
MONO ABS: 0.4 10*3/uL (ref 0.2–0.9)
Monocytes Relative: 8 %
NEUTROS ABS: 3.2 10*3/uL (ref 1.4–6.5)
Neutrophils Relative %: 64 %
PLATELETS: 165 10*3/uL (ref 150–440)
RBC: 4.58 MIL/uL (ref 3.80–5.20)
RDW: 15.5 % — AB (ref 11.5–14.5)
WBC: 5 10*3/uL (ref 3.6–11.0)

## 2016-04-28 LAB — COMPREHENSIVE METABOLIC PANEL
ALBUMIN: 4.4 g/dL (ref 3.5–5.0)
ALK PHOS: 47 U/L (ref 38–126)
ALT: 20 U/L (ref 14–54)
ANION GAP: 7 (ref 5–15)
AST: 36 U/L (ref 15–41)
BILIRUBIN TOTAL: 1.3 mg/dL — AB (ref 0.3–1.2)
BUN: 20 mg/dL (ref 6–20)
CALCIUM: 9.4 mg/dL (ref 8.9–10.3)
CO2: 29 mmol/L (ref 22–32)
Chloride: 105 mmol/L (ref 101–111)
Creatinine, Ser: 0.85 mg/dL (ref 0.44–1.00)
GFR calc Af Amer: >60 mL/min (ref >60)
GLUCOSE: 132 mg/dL — AB (ref 65–99)
Potassium: 3.9 mmol/L (ref 3.5–5.1)
Sodium: 141 mmol/L (ref 135–145)
TOTAL PROTEIN: 7.1 g/dL (ref 6.5–8.1)

## 2016-04-28 LAB — MRSA PCR SCREENING: MRSA BY PCR: NEGATIVE

## 2016-04-28 LAB — LIPASE, BLOOD: Lipase: 27 U/L (ref 11–51)

## 2016-04-28 LAB — TROPONIN I
TROPONIN I: 0.03 ng/mL — AB (ref <0.03)
TROPONIN I: 0.03 ng/mL — AB (ref <0.03)
Troponin I: <0.03 ng/mL (ref <0.03)

## 2016-04-28 LAB — APTT: aPTT: 35 seconds (ref 24–36)

## 2016-04-28 LAB — PROTIME-INR
INR: 0.97
PROTHROMBIN TIME: 12.9 s (ref 11.4–15.2)

## 2016-04-28 MED ORDER — METOPROLOL TARTRATE 25 MG PO TABS
25.0000 mg | ORAL_TABLET | Freq: Two times a day (BID) | ORAL | Status: DC
  Administered 2016-04-28 – 2016-04-30 (×5): 25 mg via ORAL
  Filled 2016-04-28 (×5): qty 1

## 2016-04-28 MED ORDER — DONEPEZIL HCL 5 MG PO TABS
10.0000 mg | ORAL_TABLET | Freq: Every day | ORAL | Status: DC
  Administered 2016-04-28 – 2016-04-29 (×2): 10 mg via ORAL
  Filled 2016-04-28 (×2): qty 2

## 2016-04-28 MED ORDER — ALENDRONATE SODIUM 70 MG PO TABS: 70.0000 mg | ORAL_TABLET | ORAL | Status: DC

## 2016-04-28 MED ORDER — DILTIAZEM HCL 100 MG IV SOLR
5.0000 mg/h | INTRAVENOUS | Status: DC
  Administered 2016-04-28: 5 mg/h via INTRAVENOUS
  Administered 2016-04-28: 10 mg/h via INTRAVENOUS
  Administered 2016-04-28: 7.5 mg/h via INTRAVENOUS
  Filled 2016-04-28 (×3): qty 100

## 2016-04-28 MED ORDER — ASPIRIN EC 81 MG PO TBEC
81.0000 mg | DELAYED_RELEASE_TABLET | Freq: Every day | ORAL | Status: DC
  Administered 2016-04-28 – 2016-04-29 (×2): 81 mg via ORAL
  Filled 2016-04-28 (×3): qty 1

## 2016-04-28 MED ORDER — SIMVASTATIN 20 MG PO TABS
10.0000 mg | ORAL_TABLET | Freq: Every day | ORAL | Status: DC
  Administered 2016-04-28 – 2016-04-29 (×2): 10 mg via ORAL
  Filled 2016-04-28 (×2): qty 1

## 2016-04-28 MED ORDER — PANTOPRAZOLE SODIUM 40 MG PO TBEC
40.0000 mg | DELAYED_RELEASE_TABLET | Freq: Every day | ORAL | Status: DC
  Administered 2016-04-28 – 2016-04-29 (×2): 40 mg via ORAL
  Filled 2016-04-28 (×3): qty 1

## 2016-04-28 MED ORDER — SODIUM CHLORIDE 0.9% FLUSH
3.0000 mL | Freq: Two times a day (BID) | INTRAVENOUS | Status: DC
  Administered 2016-04-29 – 2016-04-30 (×3): 3 mL via INTRAVENOUS

## 2016-04-28 MED ORDER — ACETAMINOPHEN 650 MG RE SUPP: 650.0000 mg | Freq: Four times a day (QID) | RECTAL | Status: DC | PRN

## 2016-04-28 MED ORDER — DILTIAZEM HCL 25 MG/5ML IV SOLN
15.0000 mg | Freq: Once | INTRAVENOUS | Status: AC
  Administered 2016-04-28: 15 mg via INTRAVENOUS
  Filled 2016-04-28: qty 5

## 2016-04-28 MED ORDER — ACETAMINOPHEN 325 MG PO TABS
650.0000 mg | ORAL_TABLET | Freq: Four times a day (QID) | ORAL | Status: DC | PRN
  Administered 2016-04-28: 650 mg via ORAL
  Filled 2016-04-28: qty 2

## 2016-04-28 MED ORDER — VITAMIN D (ERGOCALCIFEROL) 1.25 MG (50000 UNIT) PO CAPS
50000.0000 [IU] | ORAL_CAPSULE | ORAL | Status: DC
  Filled 2016-04-28: qty 1

## 2016-04-28 MED ORDER — ONDANSETRON HCL 4 MG/2ML IJ SOLN: 4.0000 mg | Freq: Four times a day (QID) | INTRAMUSCULAR | Status: DC | PRN

## 2016-04-28 MED ORDER — ONDANSETRON HCL 4 MG PO TABS: 4.0000 mg | ORAL_TABLET | Freq: Four times a day (QID) | ORAL | Status: DC | PRN

## 2016-04-28 MED ORDER — BISACODYL 10 MG RE SUPP: 10.0000 mg | Freq: Every day | RECTAL | Status: DC | PRN

## 2016-04-28 MED ORDER — SODIUM CHLORIDE 0.9 % IV BOLUS (SEPSIS)
250.0000 mL | Freq: Once | INTRAVENOUS | Status: AC
  Administered 2016-04-28: 250 mL via INTRAVENOUS

## 2016-04-28 MED ORDER — MIRABEGRON ER 50 MG PO TB24
50.0000 mg | ORAL_TABLET | Freq: Every day | ORAL | Status: DC
  Administered 2016-04-29 – 2016-04-30 (×2): 50 mg via ORAL
  Filled 2016-04-28 (×2): qty 1

## 2016-04-28 MED ORDER — ADULT MULTIVITAMIN W/MINERALS CH
1.0000 | ORAL_TABLET | Freq: Every day | ORAL | Status: DC
  Administered 2016-04-29 – 2016-04-30 (×2): 1 via ORAL
  Filled 2016-04-28 (×2): qty 1

## 2016-04-28 MED ORDER — SODIUM CHLORIDE 0.9 % IV SOLN
INTRAVENOUS | Status: DC
  Administered 2016-04-28 – 2016-04-30 (×3): via INTRAVENOUS

## 2016-04-28 MED ORDER — DOCUSATE SODIUM 100 MG PO CAPS
100.0000 mg | ORAL_CAPSULE | Freq: Two times a day (BID) | ORAL | Status: DC
  Administered 2016-04-28 – 2016-04-29 (×3): 100 mg via ORAL
  Filled 2016-04-28 (×4): qty 1

## 2016-04-28 MED ORDER — HYDRALAZINE HCL 20 MG/ML IJ SOLN
10.0000 mg | INTRAMUSCULAR | Status: DC | PRN
  Administered 2016-04-29 – 2016-04-30 (×4): 10 mg via INTRAVENOUS
  Filled 2016-04-28 (×5): qty 1

## 2016-04-28 MED ORDER — HEPARIN SODIUM (PORCINE) 5000 UNIT/ML IJ SOLN
5000.0000 [IU] | Freq: Three times a day (TID) | INTRAMUSCULAR | Status: DC
  Administered 2016-04-28 – 2016-04-30 (×4): 5000 [IU] via SUBCUTANEOUS
  Filled 2016-04-28 (×4): qty 1

## 2016-04-28 NOTE — ED Notes (Signed)
This RN notified patient up out of bed despite bed alarm. Onalee Huaavid, EDT-P assisted patient to the toilet and provided clean linens to patient. Bed alarm placed back on patient at this time.

## 2016-04-28 NOTE — Care Management Obs Status (Signed)
MEDICARE OBSERVATION STATUS NOTIFICATION   Patient Details  Name: Rebecca Reed MRN: [id number] Date of Birth: 12/29/1931   Medicare Observation Status Notification Given:  Yes (Patient with dementia. daughter Jonna CoupSherry Vassello [facility] [place] [phone] [facility] notice reviewed verbalized unserstanding copy left with patient belongings.)    Caren MacadamMichelle Braxden Lovering, RN 04/28/2016, 3:10 PM

## 2016-04-28 NOTE — Progress Notes (Signed)

## 2016-04-28 NOTE — ED Triage Notes (Addendum)
Pt here today after having midsternal chest pain, pt had relief after a BM, pt found to have a rapid irreg heart rate,  [facility]

## 2016-04-28 NOTE — ED Notes (Signed)
NAD noted at this time. Pt resting in bed with eyes closed respirations even and unlabored.

## 2016-04-28 NOTE — ED Notes (Signed)
Pt placed in yellow socks, bed alarm place don patient, call bell within reach. This RN explained to patient to use call bell and not get up without assistance. Pt states understanding, bed alarm placed due to patient hx of dementia.

## 2016-04-28 NOTE — H&P (Signed)
History and Physical    Rebecca Reed ZHY:[id number] DOB: 01/13/1932 DOA: 04/28/2016  Referring physician: Dr. Alphonzo LemmingsMcShane PCP: Leim FabryALDRIDGE,BARBARA, MD  Specialists: none  Chief Complaint: back pain with urinary frequency(per pt)  HPI: Rebecca Reed is a 82 y.o. female has a past medical history significant for progressive dementia and chronic a-fib sent from her SNF to the ER for c/o chest pain per the staff. In the ER, pt denies CP or SOB. Does c/o back pain and urinary frequency. In the ER, pt noted to be in rapid a-fib/flutter with no improvement in rate despite IV diltiazem. She is now admitted. There is no family or staff to assist in history. Pt is unsure why she is here  Review of Systems: unable to obtain due to dementia  Past Medical History:  Diagnosis Date  . Arthritis   . Atrial fibrillation and flutter (HCC)   . Complete left bundle branch block 11/30/2014  . Dementia   . HTN (hypertension)   . Hyperlipidemia   . Low back pain    Chronic  . Osteopenia   . T11 vertebral fracture (HCC)    01/14/2014  . Thrombocytopenia (HCC)    Past Surgical History:  Procedure Laterality Date  . ABDOMINAL HYSTERECTOMY    . CHOLECYSTECTOMY    . HIP ARTHROPLASTY Right 11/22/2014   Procedure: ARTHROPLASTY BIPOLAR HIP (HEMIARTHROPLASTY);  Surgeon: Kyra SearlesJohn F Sloboda, MD;  Location: [facility] ORS;  Service: Orthopedics;  Laterality: Right;  . KYPHOPLASTY N/A 03/19/2014   Procedure: Thoracic twelve KYPHOPLASTY;  Surgeon: Maeola HarmanJoseph Stern, MD;  Location: [facility];  Service: Neurosurgery;  Laterality: N/A;  . TOTAL KNEE ARTHROPLASTY     bil     Social History:  reports that she has never smoked. She has never used smokeless tobacco. She reports that she does not drink alcohol or use drugs.  Allergies  Allergen Reactions  . Other Anaphylaxis    Uncoded Allergy. Allergen: THALLIUM    Family History  Problem Relation Age of Onset  . Alzheimer's disease Mother   . Alzheimer's disease Sister      Prior to Admission medications   Medication Sig Start Date End Date Taking? Authorizing Provider  acetaminophen (TYLENOL) 500 MG tablet Take 500 mg by mouth 2 (two) times daily.   Yes Historical Provider, MD  alendronate (FOSAMAX) 70 MG tablet Take 70 mg by mouth once a week. Take with a full glass of water on an empty stomach.   Yes Historical Provider, MD  Calcium Carb-Cholecalciferol (CALCIUM 600 + D) 600-200 MG-UNIT TABS Take 1 tablet by mouth every morning.    Yes Historical Provider, MD  docusate sodium (COLACE) 100 MG capsule Take 100 mg by mouth every morning.   Yes Historical Provider, MD  donepezil (ARICEPT) 10 MG tablet Take 10 mg by mouth at bedtime.   Yes Historical Provider, MD  mirabegron ER (MYRBETRIQ) 50 MG TB24 tablet Take 50 mg by mouth daily.   Yes Historical Provider, MD  Multiple Vitamin (MULTIVITAMIN WITH MINERALS) TABS tablet Take 1 tablet by mouth daily.   Yes Historical Provider, MD  simvastatin (ZOCOR) 10 MG tablet Take 10 mg by mouth daily.    Yes Historical Provider, MD  Vitamin D, Ergocalciferol, (DRISDOL) 50000 UNITS CAPS capsule Take 50,000 Units by mouth every 7 (seven) days.   Yes Historical Provider, MD   Physical Exam: Vitals:   04/28/16 1145 04/28/16 1152 04/28/16 1200 04/28/16 1225  BP: (!) 136/99 131/83 (!) 130/114 (!) 151/85  Pulse: (!) 149 (!) 43 (!) 42 87  Resp: 19 (!) 23 18 15   Temp:      TempSrc:      SpO2: 96% 96% 95% 95%  Weight:         General:  No apparent distress, WDWN, [place]/AT  Eyes: PERRL, EOMI, no scleral icterus, conjunctiva clear  ENT: moist oropharynx without exudate, TM's benign, dentition fair  Neck: supple, no lymphadenopathy. No bruits or thyromegaly  Cardiovascular: irregularly irregular without MRG; 2+ peripheral pulses, no JVD, 1+ peripheral edema  Respiratory: CTA biL, good air movement without wheezing, rhonchi or crackled. Respiratory effort normal  Abdomen: soft, non tender to palpation, positive bowel  sounds, no guarding, no rebound  Skin: no rashes or lesions  Musculoskeletal: normal bulk and tone, no joint swelling  Psychiatric: normal mood and affect, alert, oriented to person only  Neurologic: CN 2-12 grossly intact, Motor strength 5/5 in all 4 groups with symmetric DTR's and non-focal sensory exam  Labs on Admission:  Basic Metabolic Panel:  Recent Labs Lab 04/28/16 1123  NA 141  K 3.9  CL 105  CO2 29  GLUCOSE 132*  BUN 20  CREATININE 0.85  CALCIUM 9.4   Liver Function Tests:  Recent Labs Lab 04/28/16 1123  AST 36  ALT 20  ALKPHOS 47  BILITOT 1.3*  PROT 7.1  ALBUMIN 4.4    Recent Labs Lab 04/28/16 1123  LIPASE 27   No results for input(s): AMMONIA in the last 168 hours. CBC:  Recent Labs Lab 04/28/16 1123  WBC 5.0  NEUTROABS 3.2  HGB 13.7  HCT 40.0  MCV 87.4  PLT 165   Cardiac Enzymes:  Recent Labs Lab 04/28/16 1123  TROPONINI <0.03    BNP (last 3 results) No results for input(s): BNP in the last 8760 hours.  ProBNP (last 3 results) No results for input(s): PROBNP in the last 8760 hours.  CBG: No results for input(s): GLUCAP in the last 168 hours.  Radiological Exams on Admission: Dg Chest 2 View  Result Date: 04/28/2016 CLINICAL DATA:  Acute mid sternal chest pain, tachycardia EXAM: CHEST  2 VIEW COMPARISON:  11/22/2014 FINDINGS: Cardiomegaly evident with diffuse vascular and interstitial prominence, suspect mild interstitial edema pattern. No significant effusion or pneumothorax. Trachea is midline. No superimposed pneumonia, collapse or consolidation. Aorta is atherosclerotic. Bones are osteopenic. Healed deformity of the right proximal humerus from previous fracture. Thoracic lumbar junction kyphoplasty changes noted. IMPRESSION: Mild CHF pattern. Thoracic aortic atherosclerosis Chronic osseous changes as above Electronically Signed   By: Judie PetitM.  Shick M.D.   On: 04/28/2016 13:22    EKG: Independently  reviewed.  Assessment/Plan Principal Problem:   Atrial flutter with rapid ventricular response (HCC) Active Problems:   Dementia   Hyperlipidemia   Chest pain   Will observe on telemetry. Check TSH. Follow enzymes and order echo. Begin Diltiazem drip. Consult Cardiology. Repeat labs in AM.  Diet: soft Fluids: NS@75  DVT Prophylaxis: SQ  Heparin  Code Status: FULL  Family Communication: none  Disposition Plan: SNF  Time spent: 50 min

## 2016-04-28 NOTE — ED Notes (Signed)
[facility]ity fundraiserN. Per Sammy, leave patient on 5mg  Cardizem, will titrate upon arrival to 2A.

## 2016-04-28 NOTE — ED Notes (Signed)
This RN at bedside at this time. This RN attempting multiple times to get a BP, pt noted to be tensing her arm up while BP cuff inflating. This RN remains at bedside monitor patient at this time. Pt states "it's just getting so tight", this RN explained that high BP meant cuff would get tighter, pt states understanding at this time.

## 2016-04-28 NOTE — ED Provider Notes (Addendum)
[facility] Emergency Department Provider Note  ____________________________________________   I have reviewed the triage vital signs and the nursing notes.   HISTORY  Chief Complaint Weakness; Fall; and Altered Mental Status    HPI Pt  is a 82 y.o. female who presents today complaining of nothing. Patient suffers from dementia. Apparently, she had some chest pain earlier today which is now gone. She has no complaint at this time. As her baseline according to EMS. Patient is a history of paroxysmal atrial fibrillation/flutter in the past and has been admitted for that. It does not appear that she is always on initial flutter and she does not appear to be anticoagulated. Patient does have a pacemaker. She has chronic back pain. This is known. I did discuss with her daughter as well, who states the patient has chronic back pain and this is not unusual for her. Patient denies chest pain. But she is very unreliable historian. TLevel 5 chart caveat; no further history available due to patient status.    Past Medical History:  Diagnosis Date  . Acquired complete AV block (HCC) 06/09/2012   Overview:  While on atenolol.  Again on diltiazem. Pacemaker placed 05/2012.   Marland Kitchen Anemia   . Arrhythmia   . Arthritis   . Atrial fibrillation (HCC)   . BP (high blood pressure) 02/18/2015  . Complete atrioventricular block (HCC) 06/09/2012   Overview:  While on atenolol.  Again on diltiazem. Pacemaker placed 05/2012.   . Essential hypertension 02/16/2015  . HTN (hypertension)   . Hyperlipidemia   . Ischemic colitis (HCC)   . Mobitz type 2 second degree heart block   . Pacemaker   . Stroke (HCC)   . Supraventricular tachycardia (HCC) 06/09/2012   Overview:  Short bursts at rates up to 150bpm.   . Syncope   . Thrombocytopenia (HCC)   . UTI (lower urinary tract infection) 12/30/2014  . Viral upper respiratory infection 06/01/2015    Patient Active Problem List   Diagnosis Date  Noted  . Chronic systolic congestive heart failure (HCC) 02/02/2016  . Hyponatremia 02/02/2016  . Viral upper respiratory infection 06/01/2015  . Status post left hip replacement 05/09/2015  . Anemia 04/18/2015  . Right knee injury 04/12/2015  . Low back pain 04/12/2015  . Ischemic colitis (HCC) 04/07/2015  . Bad odor of urine 03/04/2015  . Stage II pressure ulcer of left buttock 02/28/2015  . Artificial cardiac pacemaker 02/18/2015  . HLD (hyperlipidemia) 02/18/2015  . Colitis, ischemic (HCC) 02/18/2015  . Candidal intertrigo, inguinal 02/16/2015  . Essential hypertension 02/16/2015  . Monocytosis 01/27/2015  . Thrombocytopenia (HCC) 01/27/2015  . B12 deficiency 01/27/2015  . Urinary retention 01/27/2015  . Pressure ulcer of left coccygeal region, stage 2 01/27/2015  . CMML (chronic myelomonocytic leukemia) (HCC) 01/20/2015  . Bacteremia 12/30/2014  . UTI (lower urinary tract infection) 12/30/2014  . Acquired complete AV block (HCC) 06/09/2012  . Supraventricular tachycardia (HCC) 06/09/2012  . Complete atrioventricular block (HCC) 06/09/2012    Past Surgical History:  Procedure Laterality Date  . CHOLECYSTECTOMY    . Left Hip Replacement     . PACEMAKER PLACEMENT      Prior to Admission medications   Medication Sig Start Date End Date Taking? Authorizing Provider  acetaminophen (TYLENOL) 325 MG tablet Take by mouth.    Historical Provider, MD  aspirin EC 81 MG tablet Take 81 mg by mouth daily.    Historical Provider, MD  carvedilol (COREG) 12.5 MG tablet  Take 12.5 mg by mouth 2 (two) times daily.    Historical Provider, MD  furosemide (LASIX) 20 MG tablet Take 10 mg by mouth daily as needed for edema.     Historical Provider, MD  hydrALAZINE (APRESOLINE) 10 MG tablet TAKE 1 TABLET BY MOUTH TWICE DAILY 02/29/16   Glori LuisEric G Sonnenberg, MD  HYDROcodone-acetaminophen (NORCO/VICODIN) 5-325 MG per tablet Take 1 tablet by mouth 4 (four) times daily as needed for moderate pain.     Historical Provider, MD  latanoprost (XALATAN) 0.005 % ophthalmic solution Place 1 drop into both eyes at bedtime.    Historical Provider, MD  losartan (COZAAR) 100 MG tablet Take 50 mg by mouth 2 (two) times daily.    Historical Provider, MD  Multiple Vitamin (MULTIVITAMIN WITH MINERALS) TABS tablet Take 1 tablet by mouth daily.    Historical Provider, MD  Multiple Vitamins-Minerals (PRESERVISION AREDS 2) CAPS Take 2 capsules by mouth daily. Reported on 06/09/2015    Historical Provider, MD  Omega-3 Fatty Acids (FISH OIL) 1000 MG CAPS Take 2,000 mg by mouth daily.     Historical Provider, MD    Allergies Atenolol; Penicillins; Dilaudid [hydromorphone hcl]; and Hydromorphone  Family History  Problem Relation Age of Onset  . Stroke Mother   . Alcoholism Father   . Hypertension Sister   . Prostate cancer Neg Hx   . Bladder Cancer Neg Hx   . Kidney cancer Neg Hx     Social History Social History  Substance Use Topics  . Smoking status: Never Smoker  . Smokeless tobacco: Never Used  . Alcohol use 0.6 oz/week    1 Glasses of wine per week     Comment: Occasional     Review of Systems Level 5 chart caveat; no further history available due to patient status.  ____________________________________________   PHYSICAL EXAM:  VITAL SIGNS: ED Triage Vitals  Enc Vitals Group     BP 04/28/16 1323 (!) 147/67     Pulse Rate 04/28/16 1323 93     Resp 04/28/16 1323 (!) 23     Temp 04/28/16 1323 99.8 F (37.7 C)     Temp Source 04/28/16 1323 Oral     SpO2 04/28/16 1323 96 %     Weight 04/28/16 1323 110 lb (49.9 kg)     Height 04/28/16 1323 5\' 2"  (1.575 m)     Head Circumference --      Peak Flow --      Pain Score 04/28/16 1324 0     Pain Loc --      Pain Edu? --      Excl. in GC? --     Constitutional: Alert and oriented to name and place pleasantly demented. Well appearing and in no acute distress. Eyes: Conjunctivae are normal. PERRL. EOMI. Head: Atraumatic. Nose: No  congestion/rhinnorhea. Mouth/Throat: Mucous membranes are moist.  Oropharynx non-erythematous. Neck: No stridor.   Nontender with no meningismus Cardiovascular: Normal rate, regular rhythm. Grossly normal heart sounds.  Good peripheral circulation. Respiratory: Normal respiratory effort.  No retractions. Lungs CTAB. Abdominal: Soft and nontender. No distention. No guarding no rebound Back:  There is some tenderness to midthoracic there is no midline tenderness there are no lesions noted. there is no CVA tenderness Musculoskeletal: No lower extremity tenderness, no upper extremity tenderness. No joint effusions, no DVT signs strong distal pulses no edema Neurologic:  Normal speech and language. No gross focal neurologic deficits are appreciated.  Skin:  Skin is warm, dry and  intact. No rash noted. Psychiatric: Mood and affect are normal. Speech and behavior are normal.  ____________________________________________   LABS (all labs ordered are listed, but only abnormal results are displayed)  Labs Reviewed  COMPREHENSIVE METABOLIC PANEL - Abnormal; Notable for the following:       Result Value   Sodium 125 (*)    Chloride 94 (*)    Glucose, Bld 125 (*)    Creatinine, Ser 1.05 (*)    Albumin 3.4 (*)    ALT 11 (*)    GFR calc non Af Amer 45 (*)    GFR calc Af Amer 52 (*)    All other components within normal limits  CBC WITH DIFFERENTIAL/PLATELET - Abnormal; Notable for the following:    WBC 13.1 (*)    Hemoglobin 11.3 (*)    HCT 33.4 (*)    RDW 14.9 (*)    Platelets 39 (*)    All other components within normal limits  URINE CULTURE  CULTURE, BLOOD (ROUTINE X 2)  CULTURE, BLOOD (ROUTINE X 2)  RAPID INFLUENZA A&B ANTIGENS ([facility] ONLY)  LACTIC ACID, PLASMA  LACTIC ACID, PLASMA  URINALYSIS, COMPLETE (UACMP) WITH MICROSCOPIC   ____________________________________________  EKG  I personally interpreted any EKGs ordered by me or triage 1. Aflutter known lbbb rate 144 2.  Aflutter known Lbbb rate 127 ____________________________________________  RADIOLOGY  I reviewed any imaging ordered by me or triage that were performed during my shift and, if possible, patient and/or family made aware of any abnormal findings. ____________________________________________   PROCEDURES  Procedure(s) performed: None  Procedures  Critical Care performed: None  ____________________________________________   INITIAL IMPRESSION / ASSESSMENT AND PLAN / ED COURSE  Pertinent labs & imaging results that were available during my care of the patient were reviewed by me and considered in my medical decision making (see chart for details).   Somewhat limited history, patient here with a flutter, we did give Cardizem her rate has come down. She is not having active chest pain to the extent that we can determine. I cannot get a good history from the patient nor can I find someone at the nursing home who can give me an adequate history. I did discuss with the daughter, but she was not with the patient at that time. Given her a flutter, and uncertain history we will admit her for further observation.  Clinical Course    ____________________________________________   FINAL CLINICAL IMPRESSION(S) / ED DIAGNOSES  Final diagnoses:  None      This chart was dictated using voice recognition software.  Despite best efforts to proofread,  errors can occur which can change meaning.       Jeanmarie PlantJames A Chealsea Paske, MD 04/28/16 1407    Jeanmarie PlantJames A Oden Lindaman, MD 04/28/16 [phone]

## 2016-04-28 NOTE — Progress Notes (Signed)
Patient received on 2A, alert and confused. Tele box called in to CCMD, box verified with Jeri NT. Skin assessment completed with Tammy RN, no skin issues noted. Patient on Cardizem drip, rate increased to 40m/hr, patient heart rate on monitor 77-90, blood pressure sbp 161-175, dbp 98-157 on dinermap. Unable to increase rate at this time as order parameter not met. Dr SJohny Shockmade aware of findings,order to follow. Awaiting order at this time. Also made aware of the troponin result of 0.03, no new order. Patient stable at this time, no c/o chest pain. Will continue to monitor.

## 2016-04-28 NOTE — ED Notes (Signed)
Pt placed on bedpan by this RN. Will continue to monitor. Pt's HR noted to be 127 at this time. Pt denies any chest pain at this time. Pt is alert and at baseline regarding orientation. This RN remains at bedside while pt is on bedpan at this time.

## 2016-04-29 ENCOUNTER — Observation Stay
Admit: 2016-04-29 | Discharge: 2016-04-29 | Disposition: A | Discharge status: Home / Self Care | Payer: Medicare Other | Attending: Internal Medicine | Admitting: Internal Medicine

## 2016-04-29 DIAGNOSIS — I4892 Unspecified atrial flutter: Secondary | ICD-10-CM | POA: Diagnosis not present

## 2016-04-29 LAB — COMPREHENSIVE METABOLIC PANEL
ALK PHOS: 41 U/L (ref 38–126)
ALT: 25 U/L (ref 14–54)
ANION GAP: 10 (ref 5–15)
AST: 35 U/L (ref 15–41)
Albumin: 3.8 g/dL (ref 3.5–5.0)
BILIRUBIN TOTAL: 0.9 mg/dL (ref 0.3–1.2)
BUN: 21 mg/dL — ABNORMAL HIGH (ref 6–20)
CALCIUM: 8.8 mg/dL — AB (ref 8.9–10.3)
CO2: 21 mmol/L — AB (ref 22–32)
CREATININE: 0.74 mg/dL (ref 0.44–1.00)
Chloride: 110 mmol/L (ref 101–111)
GFR calc non Af Amer: >60 mL/min (ref >60)
Glucose, Bld: 127 mg/dL — ABNORMAL HIGH (ref 65–99)
Potassium: 3.4 mmol/L — ABNORMAL LOW (ref 3.5–5.1)
Sodium: 141 mmol/L (ref 135–145)
TOTAL PROTEIN: 6.2 g/dL — AB (ref 6.5–8.1)

## 2016-04-29 LAB — ECHOCARDIOGRAM COMPLETE: WEIGHTICAEL: 2491.2 [oz_av]

## 2016-04-29 LAB — TROPONIN I: Troponin I: <0.03 ng/mL (ref <0.03)

## 2016-04-29 LAB — CBC
HCT: 37.7 % (ref 35.0–47.0)
HEMOGLOBIN: 12.8 g/dL (ref 12.0–16.0)
MCH: 30.6 pg (ref 26.0–34.0)
MCHC: 34 g/dL (ref 32.0–36.0)
MCV: 90.2 fL (ref 80.0–100.0)
PLATELETS: 158 10*3/uL (ref 150–440)
RBC: 4.18 MIL/uL (ref 3.80–5.20)
RDW: 15.6 % — ABNORMAL HIGH (ref 11.5–14.5)
WBC: 5.6 10*3/uL (ref 3.6–11.0)

## 2016-04-29 MED ORDER — DILTIAZEM HCL ER COATED BEADS 120 MG PO CP24: 120.0000 mg | ORAL_CAPSULE | Freq: Every day | ORAL | Status: DC

## 2016-04-29 MED ORDER — POTASSIUM CHLORIDE CRYS ER 20 MEQ PO TBCR
40.0000 meq | EXTENDED_RELEASE_TABLET | Freq: Once | ORAL | Status: AC
  Administered 2016-04-29: 40 meq via ORAL
  Filled 2016-04-29: qty 2

## 2016-04-29 MED ORDER — DILTIAZEM HCL 60 MG PO TABS: 60.0000 mg | ORAL_TABLET | Freq: Four times a day (QID) | ORAL | Status: DC

## 2016-04-29 NOTE — Progress Notes (Signed)
Patient noted with 3.42sec pause follows by another 4.13 sec pause with HR in the low 20's. Dr Darrold JunkerParaschos cardiologist made aware.

## 2016-04-29 NOTE — Progress Notes (Signed)
[facility] [place] [facility]   PATIENT NAME: Rebecca Reed    MR#:  [id number]  DATE OF BIRTH:  04/08/1932  SUBJECTIVE:   Patient here due to chest pain and also noted to be in atrial fibrillation with rapid ventricular response. Now converted back to sinus rhythm and having pauses.  Off Cardizem gtt now.  Confused at baseline due to dementia.   REVIEW OF SYSTEMS:    Review of Systems  Unable to perform ROS: Dementia    Nutrition: soft Tolerating Diet: Yes Tolerating PT:  Await Eval.    DRUG ALLERGIES:   Allergies  Allergen Reactions  . Other Anaphylaxis    Uncoded Allergy. Allergen: THALLIUM    VITALS:  Blood pressure (!) 155/82, pulse 62, temperature 97.7 F (36.5 C), temperature source Oral, resp. rate 16, weight 70.6 kg (155 lb 11.2 oz), SpO2 96 %.  PHYSICAL EXAMINATION:   Physical Exam  GENERAL:  82 y.o.-year-old patient lying in the bed with no acute distress.  EYES: Pupils equal, round, reactive to light and accommodation. No scleral icterus. Extraocular muscles intact.  HEENT: Head atraumatic, normocephalic. Oropharynx and nasopharynx clear.  NECK:  Supple, no jugular venous distention. No thyroid enlargement, no tenderness.  LUNGS: Normal breath sounds bilaterally, no wheezing, rales, rhonchi. No use of accessory muscles of respiration.  CARDIOVASCULAR: S1, S2 Irregular. No murmurs, rubs, or gallops.  ABDOMEN: Soft, nontender, nondistended. Bowel sounds present. No organomegaly or mass.  EXTREMITIES: No cyanosis, clubbing or edema b/l.    NEUROLOGIC: Cranial nerves II through XII are intact. No focal Motor or sensory deficits b/l.   PSYCHIATRIC: The patient is alert and oriented x 3.  SKIN: No obvious rash, lesion, or ulcer.    LABORATORY PANEL:   CBC  Recent Labs Lab 04/29/16 0425  WBC 5.6  HGB 12.8  HCT 37.7  PLT 158    ------------------------------------------------------------------------------------------------------------------  Chemistries   Recent Labs Lab 04/29/16 0425  NA 141  K 3.4*  CL 110  CO2 21*  GLUCOSE 127*  BUN 21*  CREATININE 0.74  CALCIUM 8.8*  AST 35  ALT 25  ALKPHOS 41  BILITOT 0.9   ------------------------------------------------------------------------------------------------------------------  Cardiac Enzymes  Recent Labs Lab 04/29/16 0425  TROPONINI <0.03   ------------------------------------------------------------------------------------------------------------------  RADIOLOGY:  Dg Chest 2 View  Result Date: 04/28/2016 CLINICAL DATA:  Acute mid sternal chest pain, tachycardia EXAM: CHEST  2 VIEW COMPARISON:  11/22/2014 FINDINGS: Cardiomegaly evident with diffuse vascular and interstitial prominence, suspect mild interstitial edema pattern. No significant effusion or pneumothorax. Trachea is midline. No superimposed pneumonia, collapse or consolidation. Aorta is atherosclerotic. Bones are osteopenic. Healed deformity of the right proximal humerus from previous fracture. Thoracic lumbar junction kyphoplasty changes noted. IMPRESSION: Mild CHF pattern. Thoracic aortic atherosclerosis Chronic osseous changes as above Electronically Signed   By: Judie PetitM.  Shick M.D.   On: 04/28/2016 13:22     ASSESSMENT AND PLAN:   82 year old female with past medical history of chronic atrial fibrillation, dementia, hypertension, hyperlipidemia, osteoarthritis who presented to the hospital due to due to chest pain and noted to be in atrial fibrillation with rapid ventricular response.  1. Atrial fibrillation with rapid ventricular response-was on a Cardizem drip but now has been weaned off due to some bradycardia and pauses. -Continue oral metoprolol, follow heart rate. Appreciate cardiology input and no plans for acute intervention presently. -Replace potassium accordingly.  2.  Hypokalemia-we will give oral potassium supplements and repeat level in the morning.  3. Essential hypertension-continue  metoprolol tartrate.  4. GERD-continue Protonix.  5. Hyperlipidemia-continue simvastatin.  6. Dementia-continue Aricept.   All the records are reviewed and case discussed with Care Management/Social Worker. Management plans discussed with the patient, family and they are in agreement.  CODE STATUS: DO NOT RESUSCITATE  DVT Prophylaxis: Heparin subcutaneous  TOTAL TIME TAKING CARE OF THIS PATIENT: 30 minutes.   POSSIBLE D/C IN 1-2 DAYS, DEPENDING ON CLINICAL CONDITION.   Houston SirenSAINANI,VIVEK J M.D on 04/29/2016 at 1:08 PM  Between 7am to 6pm - Pager - [phone]  After 6pm go to [email or web address] - Social research officer, governmentpassword [email or web address]  [facility] [place] [facility]  Office  [phone]  CC: Primary care physician; Leim FabryALDRIDGE,BARBARA, MD

## 2016-04-29 NOTE — Consult Note (Signed)
[facility] Cardiology  CARDIOLOGY CONSULT NOTE  Patient ID: Rebecca Reed MRN: [id number] DOB/AGE: 80/06/1931 80 y.o.  Admit date: 04/28/2016 Referring Physician Cherlynn KaiserSainani Primary Physician [facility] Primary Cardiologist  Reason for Consultation Atrial fibrillation with a rapid ventricular rate  HPI: 82 year old female referred for evaluation of atrial fibrillation with a rapid ventricular rate. Patient has a history of chronic atrial fibrillation. She is a resident at a skilled nursing facility with progressive Alzheimer's dementia. [facility] room for chief complaint of chest pain. However, in the emergency room patient denied chest pain or shortness of breath. She was noted be in atrial fibrillation with a rapid ventricular rate and started on diltiazem drip. She was admitted to telemetry. Admission labs were notable for negative troponin. Heart rate currently in the 70s.  Review of systems complete and found to be negative unless listed above     Past Medical History:  Diagnosis Date  . Arthritis   . Atrial fibrillation and flutter (HCC)   . Complete left bundle branch block 11/30/2014  . Dementia   . HTN (hypertension)   . Hyperlipidemia   . Low back pain    Chronic  . Osteopenia   . T11 vertebral fracture (HCC)    01/14/2014  . Thrombocytopenia (HCC)     Past Surgical History:  Procedure Laterality Date  . ABDOMINAL HYSTERECTOMY    . CHOLECYSTECTOMY    . HIP ARTHROPLASTY Right 11/22/2014   Procedure: ARTHROPLASTY BIPOLAR HIP (HEMIARTHROPLASTY);  Surgeon: Kyra SearlesJohn F Sloboda, MD;  Location: [facility] ORS;  Service: Orthopedics;  Laterality: Right;  . KYPHOPLASTY N/A 03/19/2014   Procedure: Thoracic twelve KYPHOPLASTY;  Surgeon: Maeola HarmanJoseph Stern, MD;  Location: [facility] NEURO ORS;  Service: Neurosurgery;  Laterality: N/A;  . TOTAL KNEE ARTHROPLASTY     bil      Prescriptions Prior to Admission  Medication Sig Dispense Refill Last Dose  . acetaminophen (TYLENOL) 500 MG tablet  Take 500 mg by mouth 2 (two) times daily.   04/28/2016 at 0700  . alendronate (FOSAMAX) 70 MG tablet Take 70 mg by mouth once a week. Take with a full glass of water on an empty stomach.   04/28/2016 at 0700  . Calcium Carb-Cholecalciferol (CALCIUM 600 + D) 600-200 MG-UNIT TABS Take 1 tablet by mouth every morning.    04/28/2016 at 0700  . docusate sodium (COLACE) 100 MG capsule Take 100 mg by mouth every morning.   04/28/2016 at 0700  . donepezil (ARICEPT) 10 MG tablet Take 10 mg by mouth at bedtime.   04/27/2016 at 2100  . mirabegron ER (MYRBETRIQ) 50 MG TB24 tablet Take 50 mg by mouth daily.   04/28/2016 at 0700  . Multiple Vitamin (MULTIVITAMIN WITH MINERALS) TABS tablet Take 1 tablet by mouth daily.   04/28/2016 at 0700  . simvastatin (ZOCOR) 10 MG tablet Take 10 mg by mouth daily.    04/27/2016 at 2100  . Vitamin D, Ergocalciferol, (DRISDOL) 50000 UNITS CAPS capsule Take 50,000 Units by mouth every 7 (seven) days.   11/29/2014   Social History   Social History  . Marital status: Married    Spouse name: N/A  . Number of children: N/A  . Years of education: N/A   Occupational History  . Not on file.   Social History Main Topics  . Smoking status: Never Smoker  . Smokeless tobacco: Never Used  . Alcohol use No  . Drug use: No  . Sexual activity: Not on file   Other  Topics Concern  . Not on file   Social History Narrative  . No narrative on file    Family History  Problem Relation Age of Onset  . Alzheimer's disease Mother   . Alzheimer's disease Sister       Review of systems complete and found to be negative unless listed above      PHYSICAL EXAM  General: Well developed, well nourished, in no acute distress HEENT:  Normocephalic and atramatic Neck:  No JVD.  Lungs: Clear bilaterally to auscultation and percussion. Heart: HRRR . Normal S1 and S2 without gallops or murmurs.  Abdomen: Bowel sounds are positive, abdomen soft and non-tender  Msk:  Back normal, normal  gait. Normal strength and tone for age. Extremities: No clubbing, cyanosis or edema.   Neuro: Alert and oriented X 3. Psych:  Good affect, responds appropriately  Labs:   Lab Results  Component Value Date   WBC 5.6 04/29/2016   HGB 12.8 04/29/2016   HCT 37.7 04/29/2016   MCV 90.2 04/29/2016   PLT 158 04/29/2016    Recent Labs Lab 04/29/16 0425  NA 141  K 3.4*  CL 110  CO2 21*  BUN 21*  CREATININE 0.74  CALCIUM 8.8*  PROT 6.2*  BILITOT 0.9  ALKPHOS 41  ALT 25  AST 35  GLUCOSE 127*   Lab Results  Component Value Date   TROPONINI <0.03 04/29/2016   No results found for: CHOL No results found for: HDL No results found for: LDLCALC No results found for: TRIG No results found for: CHOLHDL No results found for: LDLDIRECT    Radiology: Dg Chest 2 View  Result Date: 04/28/2016 CLINICAL DATA:  Acute mid sternal chest pain, tachycardia EXAM: CHEST  2 VIEW COMPARISON:  11/22/2014 FINDINGS: Cardiomegaly evident with diffuse vascular and interstitial prominence, suspect mild interstitial edema pattern. No significant effusion or pneumothorax. Trachea is midline. No superimposed pneumonia, collapse or consolidation. Aorta is atherosclerotic. Bones are osteopenic. Healed deformity of the right proximal humerus from previous fracture. Thoracic lumbar junction kyphoplasty changes noted. IMPRESSION: Mild CHF pattern. Thoracic aortic atherosclerosis Chronic osseous changes as above Electronically Signed   By: Judie PetitM.  Shick M.D.   On: 04/28/2016 13:22    EKG: Atrial fibrillation with rapid ventricular rate  ASSESSMENT AND PLAN:   1. Atrial fibrillation with a rapid ventricular rate, controlled on Cardizem drip 2. Alzheimer's dementia 3. Initial complaint of chest pain, currently chest pain-free, troponin negative  Recommendations  1. Agree with current therapy 2. Defer chronic anticoagulation 3. Convert from IV to by mouth Cardizem 4. No further cardiac diagnostics at this  time  Signed: Marcina Millardlexander Jomaira Darr MD,PhD, [facility] 04/29/2016, 9:34 AM

## 2016-04-29 NOTE — Progress Notes (Signed)
Patient converted to SB in the 40s and had a pause (see strip). Cardizem gtt stopped. MD notified. Acknowledged. Patient resting in bed.

## 2016-04-29 NOTE — Progress Notes (Signed)
Late entry for 12/9: Assessment completed with patient's daughter at bedside.

## 2016-04-30 DIAGNOSIS — I4892 Unspecified atrial flutter: Secondary | ICD-10-CM | POA: Diagnosis not present

## 2016-04-30 LAB — THYROID PANEL WITH TSH
FREE THYROXINE INDEX: 1.4 (ref 1.2–4.9)
T3 Uptake Ratio: 25 % (ref 24–39)
T4 TOTAL: 5.5 ug/dL (ref 4.5–12.0)
TSH: 1.3 u[IU]/mL (ref 0.450–4.500)

## 2016-04-30 LAB — MAGNESIUM: MAGNESIUM: 2.1 mg/dL (ref 1.7–2.4)

## 2016-04-30 MED ORDER — METOPROLOL TARTRATE 25 MG PO TABS: 12.5000 mg | ORAL_TABLET | Freq: Two times a day (BID) | ORAL | 0 refills | Status: DC

## 2016-04-30 MED ORDER — POTASSIUM CHLORIDE CRYS ER 20 MEQ PO TBCR
40.0000 meq | EXTENDED_RELEASE_TABLET | Freq: Once | ORAL | Status: AC
  Administered 2016-04-30: 40 meq via ORAL
  Filled 2016-04-30: qty 2

## 2016-04-30 MED ORDER — ASPIRIN 81 MG PO TBEC
81.0000 mg | DELAYED_RELEASE_TABLET | Freq: Every day | ORAL | 0 refills | Status: AC
Start: 2016-05-01 — End: ?

## 2016-04-30 NOTE — Clinical Social Work Note (Signed)
MSW spoke to patient's daughter Tomie ChinaSharon Vasello, [phone] to inform her that patient will be discharging back to ALF.  MSW spoke to [facility], and she can accept patient back today. [facility] to be d[facility].  Patient and family agreeable to plans will transport via ems.  Patient is from ALF, no need to give report.  Windell MouldingEric Alanna Storti, MSW Mon-Fri 8a-4:30p [phone]

## 2016-04-30 NOTE — Discharge Instructions (Signed)
Atrial Fibrillation °Introduction °Atrial fibrillation is a type of heartbeat that is irregular or fast (rapid). If you have this condition, your heart keeps quivering in a weird (chaotic) way. This condition can make it so your heart cannot pump blood normally. Having this condition gives a person more risk for stroke, heart failure, and other heart problems. There are different types of atrial fibrillation. Talk with your doctor to learn about the type that you have. °Follow these instructions at home: °· Take over-the-counter and prescription medicines only as told by your doctor. °· If your doctor prescribed a blood-thinning medicine, take it exactly as told. Taking too much of it can cause bleeding. If you do not take enough of it, you will not have the protection that you need against stroke and other problems. °· Do not use any tobacco products. These include cigarettes, chewing tobacco, and e-cigarettes. If you need help quitting, ask your doctor. °· If you have apnea (obstructive sleep apnea), manage it as told by your doctor. °· Do not drink alcohol. °· Do not drink beverages that have caffeine. These include coffee, soda, and tea. °· Maintain a healthy weight. Do not use diet pills unless your doctor says they are safe for you. Diet pills may make heart problems worse. °· Follow diet instructions as told by your doctor. °· Exercise regularly as told by your doctor. °· Keep all follow-up visits as told by your doctor. This is important. °Contact a doctor if: °· You notice a change in the speed, rhythm, or strength of your heartbeat. °· You are taking a blood-thinning medicine and you notice more bruising. °· You get tired more easily when you move or exercise. °Get help right away if: °· You have pain in your chest or your belly (abdomen). °· You have sweating or weakness. °· You feel sick to your stomach (nauseous). °· You notice blood in your throw up (vomit), poop (stool), or pee (urine). °· You are  short of breath. °· You suddenly have swollen feet and ankles. °· You feel dizzy. °· Your suddenly get weak or numb in your face, arms, or legs, especially if it happens on one side of your body. °· You have trouble talking, trouble understanding, or both. °· Your face or your eyelid droops on one side. °These symptoms may be an emergency. Do not wait to see if the symptoms will go away. Get medical help right away. Call your local emergency services (911 in the [place] Do not drive yourself to the hospital.  °This information is not intended to replace advice given to you by your health care provider. Make sure you discuss any questions you have with your health care provider. °Document Released: 02/14/2008 Document Revised: 10/13/2015 Document Reviewed: 09/01/2014 °© 2017 Elsevier ° °

## 2016-04-30 NOTE — Progress Notes (Signed)
Dr. Sherryll BurgerShah on the floor. Made md aware of 5 sec pause and low hr of 29. Currently 70. No new orders. Per md with talk with pallative and patients family

## 2016-04-30 NOTE — Progress Notes (Signed)
[facility] called for transport to [facility]. No report needed per Child psychotherapistsocial worker. Patient to transport on ra. No distress noted

## 2016-04-30 NOTE — Discharge Summary (Signed)
[facility] - [place] at [facility]   PATIENT NAME: Rebecca Reed    MR#:  [id number]  DATE OF BIRTH:  Nov 23, 1931  DATE OF ADMISSION:  04/28/2016   ADMITTING PHYSICIAN: Marguarite Arbour, MD  DATE OF DISCHARGE: 04/30/2016  PRIMARY CARE PHYSICIAN: ALDRIDGE,BARBARA, MD   ADMISSION DIAGNOSIS:  Atrial flutter with rapid ventricular response (HCC) [I48.92] DISCHARGE DIAGNOSIS:  Principal Problem:   Atrial flutter with rapid ventricular response (HCC) Active Problems:   Dementia   Hyperlipidemia   Chest pain  SECONDARY DIAGNOSIS:   Past Medical History:  Diagnosis Date  . Arthritis   . Atrial fibrillation and flutter (HCC)   . Complete left bundle branch block 11/30/2014  . Dementia   . HTN (hypertension)   . Hyperlipidemia   . Low back pain    Chronic  . Osteopenia   . T11 vertebral fracture (HCC)    01/14/2014  . Thrombocytopenia [facility])    HOSPITAL COURSE:  82 year old female with past medical history of chronic atrial fibrillation, dementia, hypertension, hyperlipidemia, osteoarthritis admitted due to due to chest pain and noted to be in atrial fibrillation with rapid ventricular response.  1. Atrial fibrillation with rapid ventricular response-was on a Cardizem drip but now has been weaned off due to some bradycardia and pauses. back in normal sinus rhythm -Continue oral metoprolol as she can tolerate, follow heart rate. Appreciate cardiology input and no plans for acute intervention presently. - Not a candidate for anticoagulation  2. Hypokalemia-repleted  3. Essential hypertension-continue metoprolol tartrate.  4. GERD-continue Protonix.  5. Hyperlipidemia-continue simvastatin.  6. Dementia-continue Aricept.  She resides at [facility] assisted living and is waiting to get bed at Memory care side.  I have discussed her discharge planning with her son-in-law Gala Romney who is in agreement and so is her daughter. DISCHARGE CONDITIONS:   fair CONSULTS OBTAINED:  Treatment Team:  Marcina Millard, MD DRUG ALLERGIES:   Allergies  Allergen Reactions  . Other Anaphylaxis    Uncoded Allergy. Allergen: THALLIUM   DISCHARGE MEDICATIONS:     Medication List    TAKE these medications   acetaminophen 500 MG tablet Commonly known as:  TYLENOL Take 500 mg by mouth 2 (two) times daily.   alendronate 70 MG tablet Commonly known as:  FOSAMAX Take 70 mg by mouth once a week. Take with a full glass of water on an empty stomach.   aspirin 81 MG EC tablet Take 1 tablet (81 mg total) by mouth daily. Start taking on:  05/01/2016   CALCIUM 600 + D 600-200 MG-UNIT Tabs Generic drug:  Calcium Carb-Cholecalciferol Take 1 tablet by mouth every morning.   docusate sodium 100 MG capsule Commonly known as:  COLACE Take 100 mg by mouth every morning.   donepezil 10 MG tablet Commonly known as:  ARICEPT Take 10 mg by mouth at bedtime.   metoprolol tartrate 25 MG tablet Commonly known as:  LOPRESSOR Take 0.5 tablets (12.5 mg total) by mouth 2 (two) times daily. Hold if HR < 60 or SBP < 100   multivitamin with minerals Tabs tablet Take 1 tablet by mouth daily.   MYRBETRIQ 50 MG Tb24 tablet Generic drug:  mirabegron ER Take 50 mg by mouth daily.   simvastatin 10 MG tablet Commonly known as:  ZOCOR Take 10 mg by mouth daily.   Vitamin D (Ergocalciferol) 50000 units Caps capsule Commonly known as:  DRISDOL Take 50,000 Units by mouth every 7 (seven) days.  DISCHARGE INSTRUCTIONS:   DIET:  Cardiac diet DISCHARGE CONDITION:  Good ACTIVITY:  Activity as tolerated OXYGEN:  Home Oxygen: No.  Oxygen Delivery: room air DISCHARGE LOCATION:  nursing home   If you experience worsening of your admission symptoms, develop shortness of breath, life threatening emergency, suicidal or homicidal thoughts you must seek medical attention immediately by calling 911 or calling your MD immediately  if symptoms less  severe.  You Must read complete instructions/literature along with all the possible adverse reactions/side effects for all the Medicines you take and that have been prescribed to you. Take any new Medicines after you have completely understood and accpet all the possible adverse reactions/side effects.   Please note  You were cared for by a hospitalist during your hospital stay. If you have any questions about your discharge medications or the care you received while you were in the hospital after you are discharged, you can call the unit and asked to speak with the hospitalist on call if the hospitalist that took care of you is not available. Once you are discharged, your primary care physician will handle any further medical issues. Please note that NO REFILLS for any discharge medications will be authorized once you are discharged, as it is imperative that you return to your primary care physician (or establish a relationship with a primary care physician if you do not have one) for your aftercare needs so that they can reassess your need for medications and monitor your lab values.    On the day of Discharge:  VITAL SIGNS:  Blood pressure (!) 152/58, pulse 72, temperature 98.1 F (36.7 C), temperature source Oral, resp. rate 18, weight 72.5 kg (159 lb 13.3 oz), SpO2 95 %. PHYSICAL EXAMINATION:  GENERAL:  82 y.o.-year-old patient lying in the bed with no acute distress.  EYES: Pupils equal, round, reactive to light and accommodation. No scleral icterus. Extraocular muscles intact.  HEENT: Head atraumatic, normocephalic. Oropharynx and nasopharynx clear.  NECK:  Supple, no jugular venous distention. No thyroid enlargement, no tenderness.  LUNGS: Normal breath sounds bilaterally, no wheezing, rales,rhonchi or crepitation. No use of accessory muscles of respiration.  CARDIOVASCULAR: S1, S2 normal. No murmurs, rubs, or gallops.  ABDOMEN: Soft, non-tender, non-distended. Bowel sounds present. No  organomegaly or mass.  EXTREMITIES: No pedal edema, cyanosis, or clubbing.  NEUROLOGIC: Cranial nerves II through XII are intact. Muscle strength 5/5 in all extremities. Sensation intact. Gait not checked.  PSYCHIATRIC: The patient is alert and pleasantly confused  SKIN: No obvious rash, lesion, or ulcer.  DATA REVIEW:   CBC  Recent Labs Lab 04/29/16 0425  WBC 5.6  HGB 12.8  HCT 37.7  PLT 158    Chemistries   Recent Labs Lab 04/29/16 0425  NA 141  K 3.4*  CL 110  CO2 21*  GLUCOSE 127*  BUN 21*  CREATININE 0.74  CALCIUM 8.8*  AST 35  ALT 25  ALKPHOS 41  BILITOT 0.9    Follow-up Information    ALDRIDGE,BARBARA, MD. Schedule an appointment as soon as possible for a visit in 1 week(s).   Specialty:  Family Medicine Contact information: [address] [place] [address] [phone]        Marcina MillardAlexander Paraschos, MD. Schedule an appointment as soon as possible for a visit in 2 week(s).   Specialty:  Cardiology Contact information: [address] [facility] [place] [address] [phone]           Recommend palliative care  evaluation.  While at the facility.  High risk for readmission  Management plans discussed with the patient, family and they are in agreement.  CODE STATUS: DO NOT RESUSCITATE   TOTAL TIME TAKING CARE OF THIS PATIENT: 45 minutes.    Delfino LovettVipul Sayward Horvath M.D on 04/30/2016 at 2:03 PM  Between 7am to 6pm - Pager - [phone]  After 6pm go to [email or web address] - Social research officer, governmentpassword [email or web address] [email or web address]  [facility]  Office  [phone]  CC: Primary care physician; Leim FabryALDRIDGE,BARBARA, MD   Note: This dictation was prepared with Dragon dictation along with smaller phrase technology. Any transcriptional errors that result from this process are unintentional.

## 2016-04-30 NOTE — Progress Notes (Signed)
Family Meeting Note  Advance Directive:yes  Today a meeting took place with the Patient.  The following clinical team members were present during this meeting:MD and RN  The following were discussed:Patient's diagnosis: , Patient's progosis: < 12 months and Goals for treatment: DNR  Additional follow-up to be provided: Palliative care evaluation.  While at the facility  Please note patient's husband died in October 2017 due to aortic aneurysm.  She also had another family member died earlier this year in May and since then she has been actively declining in her clinical condition.  Time spent during discussion:20 minutes  Rebecca LovettVipul Afshin Chrystal, MD

## 2016-04-30 NOTE — Progress Notes (Signed)
Made aware from [facility] patients has a 5 second pause, heart rate 29. Sitter at bedside, stated patient was resting in bed with eyes closed. Patient currently aware no distress noted, HR 70. Will continue to monitor closely

## 2016-04-30 NOTE — [place] FL2 (Signed)
[place] MEDICAID FL2 LEVEL OF CARE SCREENING TOOL     IDENTIFICATION  Patient Name: Rebecca Reed W Miao Birthdate: 10/10/1931 Sex: female Admission Date (Current Location): 04/28/2016  [place]   Facility and Address:  [facility], [address], [place], [place] [address]      Provider Number: [id number]  Attending Physician Name and Address:  Delfino LovettVipul Shah, MD  Relative Name and Phone Number:  Cooper RenderVassello,Sharon Daughter [phone]     Current Level of Care: Hospital Recommended Level of Care: Assisted Living Facility Prior Approval Number:    Date Approved/Denied:   PASRR Number:    Discharge Plan: Other (Comment) [facility])    Current Diagnoses: Patient Active Problem List   Diagnosis Date Noted  . Atrial flutter with rapid ventricular response (HCC) 04/28/2016  . Chest pain 04/28/2016  . Anemia 12/02/2014  . Atrial fibrillation with RVR (HCC) 11/30/2014  . Dementia 11/30/2014  . Osteopenia 11/30/2014  . Hyperlipidemia 11/30/2014  . Arthritis 11/30/2014  . Atrial fibrillation with rapid ventricular response (HCC) 11/30/2014  . Closed right hip fracture (HCC) 11/22/2014  . Compression fracture 03/25/2014  . Compression fracture of body of thoracic vertebra (HCC) 03/20/2014  . Thoracic compression fracture (HCC) 03/19/2014  . Essential hypertension, benign 01/15/2014  . Hypokalemia 01/15/2014  . Closed fracture of thoracic vertebra without spinal cord injury (HCC) 01/14/2014    Orientation RESPIRATION BLADDER Height & Weight     Self  Normal Incontinent Weight: 159 lb 13.3 oz (72.5 kg) Height:     BEHAVIORAL SYMPTOMS/MOOD NEUROLOGICAL BOWEL NUTRITION STATUS      Continent Diet (Soft Diet)  AMBULATORY STATUS COMMUNICATION OF NEEDS Skin   Supervision Verbally Normal                       Personal Care Assistance Level of Assistance  Bathing, Feeding, Dressing Bathing Assistance: Limited  assistance Feeding assistance: Independent Dressing Assistance: Limited assistance     Functional Limitations Info  Sight, Hearing, Speech Sight Info: Adequate Hearing Info: Adequate Speech Info: Adequate    SPECIAL CARE FACTORS FREQUENCY                       Contractures Contractures Info: Not present    Additional Factors Info  Code Status, Allergies Code Status Info: DNR Allergies Info: Uncoded Allergy. Allergen: THALLIUM           Current Medications (04/30/2016):  This is the current hospital active medication list Current Facility-Administered Medications  Medication Dose Route Frequency Provider Last Rate Last Dose  . 0.9 %  sodium chloride infusion   Intravenous Continuous Marguarite ArbourJeffrey D Sparks, MD 75 mL/hr at 04/30/16 [phone]    . acetaminophen (TYLENOL) tablet 650 mg  650 mg Oral Q6H PRN Marguarite ArbourJeffrey D Sparks, MD   650 mg at 04/28/16 1954   Or  . acetaminophen (TYLENOL) suppository 650 mg  650 mg Rectal Q6H PRN Marguarite ArbourJeffrey D Sparks, MD      . aspirin EC tablet 81 mg  81 mg Oral Daily Marguarite ArbourJeffrey D Sparks, MD   81 mg at 04/29/16 0940  . bisacodyl (DULCOLAX) suppository 10 mg  10 mg Rectal Daily PRN Marguarite ArbourJeffrey D Sparks, MD      . docusate sodium (COLACE) capsule 100 mg  100 mg Oral BID Marguarite ArbourJeffrey D Sparks, MD   100 mg at 04/29/16 2200  . donepezil (ARICEPT) tablet 10 mg  10 mg Oral QHS  Marguarite ArbourJeffrey D Sparks, MD   10 mg at 04/29/16 2159  . heparin injection 5,000 Units  5,000 Units Subcutaneous Q8H Marguarite ArbourJeffrey D Sparks, MD   5,000 Units at 04/30/16 0600  . hydrALAZINE (APRESOLINE) injection 10 mg  10 mg Intravenous Q4H PRN Marguarite ArbourJeffrey D Sparks, MD   10 mg at 04/30/16 1248  . metoprolol tartrate (LOPRESSOR) tablet 25 mg  25 mg Oral BID Marguarite ArbourJeffrey D Sparks, MD   25 mg at 04/30/16 1007  . mirabegron ER (MYRBETRIQ) tablet 50 mg  50 mg Oral Daily Marguarite ArbourJeffrey D Sparks, MD   50 mg at 04/30/16 1007  . multivitamin with minerals tablet 1 tablet  1 tablet Oral Daily Marguarite ArbourJeffrey D Sparks, MD   1 tablet at 04/30/16 1007   . ondansetron (ZOFRAN) tablet 4 mg  4 mg Oral Q6H PRN Marguarite ArbourJeffrey D Sparks, MD       Or  . ondansetron [facility](ZOFRAN) injection 4 mg  4 mg Intravenous Q6H PRN Marguarite ArbourJeffrey D Sparks, MD      . pantoprazole (PROTONIX) EC tablet 40 mg  40 mg Oral Daily Marguarite ArbourJeffrey D Sparks, MD   40 mg at 04/29/16 0940  . simvastatin (ZOCOR) tablet 10 mg  10 mg Oral QHS Marguarite ArbourJeffrey D Sparks, MD   10 mg at 04/29/16 2200  . sodium chloride flush (NS) 0.9 % injection 3 mL  3 mL Intravenous Q12H Marguarite ArbourJeffrey D Sparks, MD   3 mL at 04/30/16 1017  . Vitamin D (Ergocalciferol) (DRISDOL) capsule 50,000 Units  50,000 Units Oral Q7 days Marguarite ArbourJeffrey D Sparks, MD         Discharge Medications: Please see discharge summary for a list of discharge medications.  Relevant Imaging Results:  Relevant Lab Results:   Additional Information SSN [id number]  Darleene Cleavernterhaus, Neziah Vogelgesang R

## 2016-04-30 NOTE — Progress Notes (Signed)
Ems on the floor to transport patient to [facility] on ra. No distress noted. vss

## 2016-05-09 ENCOUNTER — Non-Acute Institutional Stay: Payer: Medicare Other | Admitting: Internal Medicine

## 2016-05-09 ENCOUNTER — Encounter: Payer: Self-pay | Admitting: Internal Medicine

## 2016-05-09 VITALS — BP 156/92 | HR 84 | Wt 165.0 lb

## 2016-05-09 DIAGNOSIS — G301 Alzheimer's disease with late onset: Secondary | ICD-10-CM

## 2016-05-09 DIAGNOSIS — F0281 Dementia in other diseases classified elsewhere with behavioral disturbance: Secondary | ICD-10-CM | POA: Diagnosis not present

## 2016-05-09 MED ORDER — MEMANTINE HCL 5 MG PO TABS: 5.0000 mg | ORAL_TABLET | Freq: Every day | ORAL | 0 refills | Status: AC

## 2016-05-09 NOTE — Patient Instructions (Signed)
Alzheimer Disease Alzheimer disease is a brain disease that affects memory, thinking, and behavior. People with Alzheimer disease lose mental abilities, and the disease gets worse over time. Survival with Alzheimer disease ranges from several years to as long as 20 years. What are the causes? This condition develops when a protein called beta-amyloid forms deposits in the brain. It is not known what causes these deposits to form. What increases the risk? This condition is more likely to develop in people who:  Are elderly.  Have a family history of dementia.  Have had a brain injury.  Have heart or blood vessel disease.  Have had a stroke.  Have high blood pressure or high cholesterol.  Have diabetes. What are the signs or symptoms? Symptoms of this condition happen in three stages, which often overlap. Early stage In this stage, you may continue to be independent. You may still be able to drive, work, and be social. Symptoms in this stage include:  Minor memory problems, such as forgetting a name or what you read.  Difficulty with:  Paying attention.  Communicating.  Doing familiar tasks.  Learning new things.  Needing more time to do daily activities.  Anxiety.  Social withdrawal.  Loss of motivation. Moderate stage In this stage, you will start to need care. This stage usually lasts the longest. Symptoms in this stage include:  Difficulty with expressing thoughts.  Memory loss that affects daily life. This can include forgetting:  Your address or phone number.  Events that have happened.  Parts of your personal history, like where you went to school.  Confusion about where you are or what time it is.  Difficulty in judging distance.  Changes in personality, mood, and behavior. You may be moody, irritable, angry, frustrated, fearful, anxious, or suspicious.  Poor reasoning and judgment.  Delusions or hallucinations.  Changes in sleep  patterns.  Wandering and getting lost. Severe stage In the final stage, you will need help with your personal care and dailyactivities. Symptoms in this stage include:  Worsening memory loss.  Personality changes.  Loss of awareness of your surroundings.  Changes in physical abilities, including the ability to walk, sit, and swallow.  Difficulty in communicating.  Inability to control the bladder and bowels.  Increasing confusion.  Increasing disruptive behavior. How is this diagnosed? This condition is diagnosed with an assessment by your health care provider. During this assessment, your health care provider will talk with you and your family, friends, or caregivers about your symptoms. A thorough medical history will be taken, and you will have a physical exam and tests. Tests may include:  Lab tests, such as blood or urine tests.  Imaging tests, such as a CT scan, PET scan, or MRI.  A lumbar puncture. This test involves removing and testing a small amount of the fluid that surrounds the brain and spinal cord.  An electroencephalogram (EEG). In this test, small metal discs are used to measure electrical activity in the brain.  Memory tests, cognitive tests, and neuropsychological tests. These tests evaluate brain function. How is this treated? At this time, there is no treatment to cure Alzheimer disease or stop it from getting worse. The goals of treatment are:  To slow down the disease.  To manage behavioral problems.  To provide you with a safe environment.  To make life easier for you and your caregivers. The following treatment options are available:  Medicines. Medicines may help to slow down memory loss and control behavioral symptoms.    Talk therapy. Talk therapy provides you with education, support, and memory aids. It is most helpful in the early stages of the condition.  Counseling or spiritual guidance. It is normal to have a lot of feelings, including  anger, relief, fear, and isolation. Counseling and guidance can help you deal with these feelings.  Caregiving. This involves having caregivers help you with your daily activities. Caregivers may be family members, friends, or trained medical professionals. Caregiving can be done at home or outside the home.  Family support groups. These provide education, emotional support, and information about community resources to family members who are taking care of you. Follow these instructions at home: Medicines  Take over-the-counter and prescription medicines only as told by your health care provider.  Avoid taking medicines that can affect thinking, such as pain or sleeping medicines. Lifestyle  Make healthy lifestyle choices:  Be physically active as told by your health care provider.  Do not use any tobacco products, such as cigarettes, chewing tobacco, and e-cigarettes. If you need help quitting, ask your health care provider.  Eat a healthy diet.  Practice stress-management techniques when you get stressed.  Stay social.  Drink enough fluid to keep your urine clear or pale yellow.  Make sure to get quality sleep. These tips can help you get a good night's rest:  Avoid napping during the day.  Keep your sleeping area dark and cool.  Avoid exercising during the few hours before you go to bed.  Avoid caffeine products in the evening. General instructions  Work with your health care provider to determine what you need help with and what your safety needs are.  If you were given a bracelet that tracks your location, make sure to wear it.  Keep all follow-up visits as told by your health care provider. This is important.  If you have questions or would like additional support, you may contact The Alzheimer's Association:  24-hour helpline: 1-800-272-3900  Website: www.alz.org Contact a health care provider if:  You have nausea, vomiting, or trouble with eating.  You  have dizziness, or weakness.  You have new or worsening trouble with sleeping.  You or your family members become concerned for your safety. Get help right away if:  You develop chest pain or difficulty with breathing.  You pass out. This information is not intended to replace advice given to you by your health care provider. Make sure you discuss any questions you have with your health care provider. Document Released: 01/17/2004 Document Revised: 01/06/2016 Document Reviewed: 02/02/2015 Elsevier Interactive Patient Education  2017 Elsevier Inc.  

## 2016-05-09 NOTE — Progress Notes (Signed)
Subjective:    Patient ID: Rebecca SalvageEstelle W Collett, female    DOB: 09/23/1931, 82 y.o.   MRN: [id number]  HPI  Asked to evaluate resident in [address] RN concerned about onset of anxiety, starting in the evening before bed RN reports resident is often tearful, anxious and looking for her mother Some delusions but no agitation Hx of dementia on Aricept 10 mg daily  Review of Systems      Past Medical History:  Diagnosis Date  . Arthritis   . Atrial fibrillation and flutter (HCC)   . Complete left bundle branch block 11/30/2014  . Dementia   . HTN (hypertension)   . Hyperlipidemia   . Low back pain    Chronic  . Osteopenia   . T11 vertebral fracture (HCC)    01/14/2014  . Thrombocytopenia (HCC)     Current Outpatient Prescriptions  Medication Sig Dispense Refill  . acetaminophen (TYLENOL) 500 MG tablet Take 500 mg by mouth 2 (two) times daily.    Marland Kitchen. alendronate (FOSAMAX) 70 MG tablet Take 70 mg by mouth once a week. Take with a full glass of water on an empty stomach.    Marland Kitchen. aspirin EC 81 MG EC tablet Take 1 tablet (81 mg total) by mouth daily. 30 tablet 0  . Calcium Carb-Cholecalciferol (CALCIUM 600 + D) 600-200 MG-UNIT TABS Take 1 tablet by mouth every morning.     . docusate sodium (COLACE) 100 MG capsule Take 100 mg by mouth every morning.    . donepezil (ARICEPT) 10 MG tablet Take 10 mg by mouth at bedtime.    . metoprolol tartrate (LOPRESSOR) 25 MG tablet Take 0.5 tablets (12.5 mg total) by mouth 2 (two) times daily. Hold if HR < 60 or SBP < 100 60 tablet 0  . mirabegron ER (MYRBETRIQ) 50 MG TB24 tablet Take 50 mg by mouth daily.    . Multiple Vitamin (MULTIVITAMIN WITH MINERALS) TABS tablet Take 1 tablet by mouth daily.    . simvastatin (ZOCOR) 10 MG tablet Take 10 mg by mouth daily.     . Vitamin D, Ergocalciferol, (DRISDOL) 50000 UNITS CAPS capsule Take 50,000 Units by mouth every 7 (seven) days.     No current facility-administered medications for this visit.      Allergies  Allergen Reactions  . Other Anaphylaxis    Uncoded Allergy. Allergen: THALLIUM    Family History  Problem Relation Age of Onset  . Alzheimer's disease Mother   . Alzheimer's disease Sister     Social History   Social History  . Marital status: Married    Spouse name: N/A  . Number of children: N/A  . Years of education: N/A   Occupational History  . Not on file.   Social History Main Topics  . Smoking status: Never Smoker  . Smokeless tobacco: Never Used  . Alcohol use No  . Drug use: No  . Sexual activity: Not on file   Other Topics Concern  . Not on file   Social History Narrative  . No narrative on file     Constitutional: Denies fever, malaise, fatigue, headache or abrupt weight changes.  Neurological: Pt reports difficulty with memory. Denies dizziness, difficulty with speech or problems with balance and coordination.  Psych: Denies anxiety, depression, SI/HI.  No other specific complaints in a complete review of systems (except as listed in HPI above).  Objective:   Physical Exam   BP (!) 156/92   Pulse 84  Wt 165 lb (74.8 kg)   BMI 29.23 kg/m  Wt Readings from Last 3 Encounters:  05/09/16 165 lb (74.8 kg)  04/30/16 159 lb 13.3 oz (72.5 kg)  12/02/14 152 lb 11.2 oz (69.3 kg)    General: Appears her stated age, in NAD. Neurological: Alert. Engages. Not actively delusional or confused at this time. Psychiatric: Mood and affect normal. Behavior is normal. Judgment and thought content normal.    BMET   Lipid Panel  No results found for: CHOL, TRIG, HDL, CHOLHDL, VLDL, LDLCALC  CBC    Component Value Date/Time   WBC 5.6 04/29/2016 0425   RBC 4.18 04/29/2016 0425   HGB 12.8 04/29/2016 0425   HGB 13.4 03/25/2014 0521   HCT 37.7 04/29/2016 0425   HCT 40.1 03/25/2014 0521   PLT 158 04/29/2016 0425   PLT 146 (L) 03/25/2014 0521   MCV 90.2 04/29/2016 0425   MCV 88 03/25/2014 0521   MCH 30.6 04/29/2016 0425   MCHC 34.0  04/29/2016 0425   RDW 15.6 (H) 04/29/2016 0425   RDW 13.9 03/25/2014 0521   LYMPHSABS 1.2 04/28/2016 1123   LYMPHSABS 1.5 03/25/2014 0521   MONOABS 0.4 04/28/2016 1123   MONOABS 0.6 03/25/2014 0521   EOSABS 0.1 04/28/2016 1123   EOSABS 0.1 03/25/2014 0521   BASOSABS 0.0 04/28/2016 1123   BASOSABS 0.0 03/25/2014 0521    Hgb A1C No results found for: HGBA1C      Assessment & Plan:   Delusions, anxiety:  Likely worsening dementia Will change Aricept to 10 mg daily in am Add Namenda 5 mg daily in pm RN will monitor  Will follow up as needed Nicki ReaperBAITY, Chantay Whitelock, NP

## 2016-07-05 DIAGNOSIS — I10 Essential (primary) hypertension: Secondary | ICD-10-CM | POA: Diagnosis not present

## 2016-07-05 DIAGNOSIS — N3941 Urge incontinence: Secondary | ICD-10-CM | POA: Diagnosis not present

## 2016-07-05 DIAGNOSIS — D696 Thrombocytopenia, unspecified: Secondary | ICD-10-CM

## 2016-07-05 DIAGNOSIS — G301 Alzheimer's disease with late onset: Secondary | ICD-10-CM | POA: Diagnosis not present

## 2016-07-05 DIAGNOSIS — M810 Age-related osteoporosis without current pathological fracture: Secondary | ICD-10-CM

## 2016-07-09 ENCOUNTER — Telehealth: Payer: Self-pay

## 2016-07-09 NOTE — Telephone Encounter (Signed)
   [facility] REPORT:  [facility] - Night Physician Tillman AbideLetvak, Richard - MD Contact Type Call Who Is Calling Physician / Provider / Hospital Call Type [facility] Reason for Call Request to speak to Physician Initial Comment Caller States Olegario [facility]athy from [facility] at [phone] Pt on meds questions, BP 177/108 Pulse 125 Additional Comment Patient Name Rebecca Reed Patient DOB [birth year] [facility] Number [phone] [facility] Paging DoctorName Phone DateTime Result/Outcome Message Type Notes Ruthe Mannanron, Talia - MD [phone] 07/08/2016 6:32:56 PM Paged On Call Back to Call Center Doctor Paged Please call Reuel BoomDaniel with [employer] at [phone] regarding a page. Ruthe MannanAron, Talia - MD 07/08/2016 6:34:22 PM Spoke with On Call - General Message Result Call Closed By: De Hollingsheadaniel Goldston Transaction Date/

## 2016-07-09 NOTE — Telephone Encounter (Signed)
[facility]--she is okay

## 2016-08-09 DIAGNOSIS — M810 Age-related osteoporosis without current pathological fracture: Secondary | ICD-10-CM

## 2016-08-09 DIAGNOSIS — N3941 Urge incontinence: Secondary | ICD-10-CM | POA: Diagnosis not present

## 2016-08-09 DIAGNOSIS — I10 Essential (primary) hypertension: Secondary | ICD-10-CM

## 2016-08-09 DIAGNOSIS — M545 Low back pain: Secondary | ICD-10-CM

## 2016-08-09 DIAGNOSIS — G301 Alzheimer's disease with late onset: Secondary | ICD-10-CM | POA: Diagnosis not present

## 2016-08-15 ENCOUNTER — Telehealth: Payer: Self-pay

## 2016-08-15 NOTE — Telephone Encounter (Signed)
pts daughter, Jasmine DecemberSharon left v/m (POA under media tab)and asked Dr Alphonsus SiasLetvak to see pt at [facility] and evaluate her that pt is able to understand legal documents and to understand what a Power of Gerrit Friendsttorney process is and is capable to sign legal documents. Pt signed legal document on  08/01/16 and removed [employer] off POA and put Jasmine DecemberSharon and her husband as [place].Jasmine December.Sharon request cb when done.

## 2016-08-15 NOTE — Telephone Encounter (Signed)
I will try to assess her in the morning and make a note about it

## 2016-09-10 ENCOUNTER — Telehealth: Payer: Self-pay | Admitting: *Deleted

## 2016-09-10 NOTE — Telephone Encounter (Signed)
Patient's daughter (Sharon) called requeJasmine Decemberg a call back regarding a legal issue that she has been talking with you about. Jasmine December stated that she is requesting a note stating that her mom can not make financial decisions on her own. Jasmine December stated that this is opposite from what she has been asking and requested you call her back to discuss this.

## 2016-09-10 NOTE — Telephone Encounter (Signed)
Message left Will try again tomorrow 

## 2016-09-11 NOTE — Telephone Encounter (Signed)
Discussed with her I can write that letter

## 2016-09-12 ENCOUNTER — Encounter: Payer: Self-pay | Admitting: Internal Medicine

## 2016-09-12 ENCOUNTER — Emergency Department: Payer: Medicare Other

## 2016-09-12 ENCOUNTER — Emergency Department
Admission: EM | Admit: 2016-09-12 | Discharge: 2016-09-12 | Disposition: A | Discharge status: Home / Self Care | Payer: Medicare Other | Attending: Emergency Medicine | Admitting: Emergency Medicine

## 2016-09-12 DIAGNOSIS — I10 Essential (primary) hypertension: Secondary | ICD-10-CM | POA: Insufficient documentation

## 2016-09-12 DIAGNOSIS — R791 Abnormal coagulation profile: Secondary | ICD-10-CM | POA: Insufficient documentation

## 2016-09-12 DIAGNOSIS — R7989 Other specified abnormal findings of blood chemistry: Secondary | ICD-10-CM | POA: Diagnosis not present

## 2016-09-12 DIAGNOSIS — I472 Ventricular tachycardia: Secondary | ICD-10-CM | POA: Diagnosis not present

## 2016-09-12 DIAGNOSIS — R3981 Functional urinary incontinence: Secondary | ICD-10-CM | POA: Diagnosis not present

## 2016-09-12 DIAGNOSIS — R778 Other specified abnormalities of plasma proteins: Secondary | ICD-10-CM

## 2016-09-12 DIAGNOSIS — G309 Alzheimer's disease, unspecified: Secondary | ICD-10-CM

## 2016-09-12 DIAGNOSIS — R000 Tachycardia, unspecified: Secondary | ICD-10-CM

## 2016-09-12 DIAGNOSIS — Z79899 Other long term (current) drug therapy: Secondary | ICD-10-CM | POA: Insufficient documentation

## 2016-09-12 DIAGNOSIS — I471 Supraventricular tachycardia: Secondary | ICD-10-CM | POA: Diagnosis not present

## 2016-09-12 DIAGNOSIS — Z7982 Long term (current) use of aspirin: Secondary | ICD-10-CM | POA: Insufficient documentation

## 2016-09-12 DIAGNOSIS — M545 Low back pain: Secondary | ICD-10-CM

## 2016-09-12 DIAGNOSIS — M810 Age-related osteoporosis without current pathological fracture: Secondary | ICD-10-CM

## 2016-09-12 DIAGNOSIS — Z7689 Persons encountering health services in other specified circumstances: Secondary | ICD-10-CM

## 2016-09-12 LAB — COMPREHENSIVE METABOLIC PANEL
ALBUMIN: 4.4 g/dL (ref 3.5–5.0)
ALT: 24 U/L (ref 14–54)
ANION GAP: 9 (ref 5–15)
AST: 29 U/L (ref 15–41)
Alkaline Phosphatase: 46 U/L (ref 38–126)
BUN: 44 mg/dL — ABNORMAL HIGH (ref 6–20)
CO2: 28 mmol/L (ref 22–32)
Calcium: 9.8 mg/dL (ref 8.9–10.3)
Chloride: 105 mmol/L (ref 101–111)
Creatinine, Ser: 1.41 mg/dL — ABNORMAL HIGH (ref 0.44–1.00)
GFR calc Af Amer: 39 mL/min — ABNORMAL LOW (ref >60)
GFR calc non Af Amer: 33 mL/min — ABNORMAL LOW (ref >60)
GLUCOSE: 98 mg/dL (ref 65–99)
POTASSIUM: 3.6 mmol/L (ref 3.5–5.1)
SODIUM: 142 mmol/L (ref 135–145)
Total Bilirubin: 0.9 mg/dL (ref 0.3–1.2)
Total Protein: 7.3 g/dL (ref 6.5–8.1)

## 2016-09-12 LAB — CBC
HCT: 40.8 % (ref 35.0–47.0)
HEMOGLOBIN: 13.7 g/dL (ref 12.0–16.0)
MCH: 28.6 pg (ref 26.0–34.0)
MCHC: 33.6 g/dL (ref 32.0–36.0)
MCV: 85 fL (ref 80.0–100.0)
Platelets: 187 10*3/uL (ref 150–440)
RBC: 4.8 MIL/uL (ref 3.80–5.20)
RDW: 17.9 % — ABNORMAL HIGH (ref 11.5–14.5)
WBC: 6.1 10*3/uL (ref 3.6–11.0)

## 2016-09-12 LAB — PROTIME-INR
INR: 1.03
PROTHROMBIN TIME: 13.5 s (ref 11.4–15.2)

## 2016-09-12 LAB — TROPONIN I: Troponin I: 0.06 ng/mL (ref <0.03)

## 2016-09-12 LAB — APTT: aPTT: 39 seconds — ABNORMAL HIGH (ref 24–36)

## 2016-09-12 MED ORDER — METOPROLOL TARTRATE 50 MG PO TABS: 50.0000 mg | ORAL_TABLET | Freq: Two times a day (BID) | ORAL | 2 refills | Status: AC

## 2016-09-12 MED ORDER — DILTIAZEM LOAD VIA INFUSION
20.0000 mg | Freq: Once | INTRAVENOUS | Status: AC
  Administered 2016-09-12: 20 mg via INTRAVENOUS
  Filled 2016-09-12: qty 20

## 2016-09-12 MED ORDER — DILTIAZEM HCL 100 MG IV SOLR
5.0000 mg/h | Freq: Once | INTRAVENOUS | Status: AC
  Administered 2016-09-12: 5 mg/h via INTRAVENOUS
  Filled 2016-09-12: qty 100

## 2016-09-12 MED ORDER — DILTIAZEM HCL 25 MG/5ML IV SOLN: 20.0000 mg | Freq: Once | INTRAVENOUS | Status: DC

## 2016-09-12 MED ORDER — METOPROLOL TARTRATE 50 MG PO TABS
50.0000 mg | ORAL_TABLET | Freq: Once | ORAL | Status: AC
  Administered 2016-09-12: 50 mg via ORAL
  Filled 2016-09-12: qty 1

## 2016-09-12 NOTE — ED Notes (Signed)
Date and time results received: 09/12/16 10:32 AM  (use smartphrase ".now" to insert current time)  Test: troponin Critical Value: 0.06  Name of Provider Notified: Dr. Mayford Knife  Orders Received? Or Actions Taken?: Orders Received - See Orders for details

## 2016-09-12 NOTE — Telephone Encounter (Signed)
I left a message on daughter's voice mail that form is ready for pick up and of $20 charge.  I let her know if she wants me to mail the letter, to call me back.

## 2016-09-12 NOTE — ED Notes (Signed)
E-signature box not working. Pt daughter verbalized understanding of discharge instructions and denied questions. Pt daughter stated she would return pt DNR and discharge papers to [facility] memory care.

## 2016-09-12 NOTE — ED Provider Notes (Signed)
[facility] Emergency Department Provider Note       Time seen: ----------------------------------------- 9:49 AM on 09/12/2016 -----------------------------------------  L5 caveat: Review of systems and history is limited by dementia   I have reviewed the triage vital signs and the nursing notes.   HISTORY   Chief Complaint No chief complaint on file.    HPI Rebecca Reed is a 83 y.o. female who presents to the ED for fast heartbeat. Patient is from the memory care unit at the nursing home and cannot give any review of systems or report due to dementia. She was found to have fast heartbeat on routine vital signs.   Past Medical History:  Diagnosis Date  . Arthritis   . Atrial fibrillation and flutter (HCC)   . Complete left bundle branch block 11/30/2014  . Dementia   . HTN (hypertension)   . Hyperlipidemia   . Low back pain    Chronic  . Osteopenia   . T11 vertebral fracture (HCC)    01/14/2014  . Thrombocytopenia [place])     Patient Active Problem List   Diagnosis Date Noted  . Atrial flutter with rapid ventricular response (HCC) 04/28/2016  . Chest pain 04/28/2016  . Anemia 12/02/2014  . Atrial fibrillation with RVR (HCC) 11/30/2014  . Dementia 11/30/2014  . Osteopenia 11/30/2014  . Hyperlipidemia 11/30/2014  . Arthritis 11/30/2014  . Atrial fibrillation with rapid ventricular response (HCC) 11/30/2014  . Closed right hip fracture (HCC) 11/22/2014  . Compression fracture 03/25/2014  . Compression fracture of body of thoracic vertebra (HCC) 03/20/2014  . Thoracic compression fracture (HCC) 03/19/2014  . Essential hypertension, benign 01/15/2014  . Hypokalemia 01/15/2014  . Closed fracture of thoracic vertebra without spinal cord injury (HCC) 01/14/2014    Past Surgical History:  Procedure Laterality Date  . ABDOMINAL HYSTERECTOMY    . CHOLECYSTECTOMY    . HIP ARTHROPLASTY Right 11/22/2014   Procedure: ARTHROPLASTY BIPOLAR HIP  (HEMIARTHROPLASTY);  Surgeon: Kyra Searles, MD;  Location: [facility] ORS;  Service: Orthopedics;  Laterality: Right;  . KYPHOPLASTY N/A 03/19/2014   Procedure: Thoracic twelve KYPHOPLASTY;  Surgeon: Maeola Harman, MD;  Location: [facility];  Service: Neurosurgery;  Laterality: N/A;  . TOTAL KNEE ARTHROPLASTY     bil      Allergies Other  Social History Social History  Substance Use Topics  . Smoking status: Never Smoker  . Smokeless tobacco: Never Used  . Alcohol use No    Review of Systems Constitutional: Negative for fever. Eyes: Negative for vision changes ENT:  Negative for congestion, sore throat Cardiovascular: Negative for chest pain.Positive for palpitations Respiratory: Negative for shortness of breath. Gastrointestinal: Negative for abdominal pain, vomiting and diarrhea. Genitourinary: Negative for dysuria. Musculoskeletal: Negative for back pain. Skin: Negative for rash. Neurological: Negative for headaches, focal weakness or numbness.  All other systems are reviewed and otherwise negative  ____________________________________________   PHYSICAL EXAM:  VITAL SIGNS: ED Triage Vitals  Enc Vitals Group     BP      Pulse      Resp      Temp      Temp src      SpO2      Weight      Height      Head Circumference      Peak Flow      Pain Score      Pain Loc      Pain Edu?      Excl.  in GC?     Constitutional: Alert And disoriented. Well appearing and in no distress. Eyes: Conjunctivae are normal. PERRL. Normal extraocular movements. ENT   Head: Normocephalic and atraumatic.   Nose: No congestion/rhinnorhea.   Mouth/Throat: Mucous membranes are moist.   Neck: No stridor. Cardiovascular:Rapid rate, regular rhythm. No murmurs, rubs, or gallops. Respiratory: Normal respiratory effort without tachypnea nor retractions. Breath sounds are clear and equal bilaterally. No wheezes/rales/rhonchi. Gastrointestinal: Soft and nontender. Normal bowel  sounds Musculoskeletal: Nontender with normal range of motion in extremities. No lower extremity tenderness nor edema. Neurologic:  Normal speech and language. No gross focal neurologic deficits are appreciated.  Skin:  Skin is warm, dry and intact. No rash noted. Psychiatric: Mood and affect are normal. Speech and behavior are normal.  ____________________________________________  EKG: Interpreted by me. Wide complex tachycardia with a rate of 157 bpm, left bundle branch block, normal axis, ST depressions inferiorly  Repeat EKG interpreted by me after IV Cardizem reveals left bundle branch block, bigeminy, junctional rhythm. Normal QT interval  ____________________________________________  ED COURSE:  Pertinent labs & imaging results that were available during my care of the patient were reviewed by me and considered in my medical decision making (see chart for details). Patient presents for fast heartbeat, we will assess with labs and imaging as indicated. Clinical Course as of Sep 13 1130  Wed Sep 12, 2016  1015 Heart rate is dramatically improved after IV Cardizem  [JW]    Clinical Course User Index [JW] Emily Filbert, MD   Procedures ____________________________________________   LABS (pertinent positives/negatives)  Labs Reviewed  CBC - Abnormal; Notable for the following:       Result Value   RDW 17.9 (*)    All other components within normal limits  TROPONIN I - Abnormal; Notable for the following:    Troponin I 0.06 (*)    All other components within normal limits  COMPREHENSIVE METABOLIC PANEL - Abnormal; Notable for the following:    BUN 44 (*)    Creatinine, Ser 1.41 (*)    GFR calc non Af Amer 33 (*)    GFR calc Af Amer 39 (*)    All other components within normal limits  APTT - Abnormal; Notable for the following:    aPTT 39 (*)    All other components within normal limits  PROTIME-INR   CRITICAL CARE Performed by: Emily Filbert   Total  critical care time: 30 minutes  Critical care time was exclusive of separately billable procedures and treating other patients.  Critical care was necessary to treat or prevent imminent or life-threatening deterioration.  Critical care was time spent personally by me on the following activities: development of treatment plan with patient and/or surrogate as well as nursing, discussions with consultants, evaluation of patient's response to treatment, examination of patient, obtaining history from patient or surrogate, ordering and performing treatments and interventions, ordering and review of laboratory studies, ordering and review of radiographic studies, pulse oximetry and re-evaluation of patient's condition.  RADIOLOGY Images were viewed by me  Chest x-ray IMPRESSION: Stable mild central pulmonary vascular congestion. Aortic atherosclerosis. No significant change compared to prior exam. ____________________________________________  FINAL ASSESSMENT AND PLAN  Wide complex tachycardia, elevated troponin  Plan: Patient's labs and imaging were dictated above. Patient had presented for fast heartbeat and was given IV Cardizem and placed on a Cardizem drip. Currently she is rate controlled but remains in atrial fibrillation. According to previous notes she is not  a candidate for anticoagulation. Her troponin is elevated, I will discuss with the hospitalist for observation.   Emily Filbert, MD   Note: This note was generated in part or whole with voice recognition software. Voice recognition is usually quite accurate but there are transcription errors that can and very often do occur. I apologize for any typographical errors that were not detected and corrected.     Emily Filbert, MD 09/12/16 1149

## 2016-09-12 NOTE — Consult Note (Addendum)
Medical Consultation  MACHELLE RAYBON ZOX:[id number] DOB: 26-Sep-1931 DOA: 09/12/2016 PCP: Leim Fabry, MD   Requesting physician: dr Mayford Knife Date of consultation: 09/12/2016 Reason for consultation:  Wide complex tachcyardia  Impression/Recommendations  83 year old female with severe dementia and history of atrial fibrillation who presents with asymptomatic tachycardia.  1. Asymptomatic wide complex tachycardia:Is poke with Dr Juliann Pares her will make adjustments to current medications. She will have follow up in 1 week. 2. Elevated troponin from demand ischemia  3. PAF: high risk of falls no anticoagulation Continue BB for HR control  4. Advanced dementia: Due to advanced dementia, patient's daughter did not want patient to stay in hospital which is reasonable since we have made adjustments to her rate controlling medications.  Chief Complaint:  Patient sent due to asymptomatic elevated heart rate  HPI:   83 year old female who was admitted in December for atrial fibrillation with RVR and advanced dementia who presented today due to asymptomatic tachycardia. Apparently at [facility] memory care unit nursing staff was taking routine vitals and found that her heart rate is elevated. Patient has severe dementia and is unable to provide history of present illness.  Daughter is at bedside who would like the patient to be treated in the emergency room and for the patient to be discharged back to the facility today due to advanced dementia and high risk of agitation.  Review of Systems  Due to advanced dementia unable to obtain ROS  Past Medical History:  Diagnosis Date  . Arthritis   . Atrial fibrillation and flutter (HCC)   . Complete left bundle branch block 11/30/2014  . Dementia   . HTN (hypertension)   . Hyperlipidemia   . Low back pain    Chronic  . Osteopenia   . T11 vertebral fracture (HCC)    01/14/2014  . Thrombocytopenia (HCC)    Past Surgical History:   Procedure Laterality Date  . ABDOMINAL HYSTERECTOMY    . CHOLECYSTECTOMY    . HIP ARTHROPLASTY Right 11/22/2014   Procedure: ARTHROPLASTY BIPOLAR HIP (HEMIARTHROPLASTY);  Surgeon: Kyra Searles, MD;  Location: [facility] ORS;  Service: Orthopedics;  Laterality: Right;  . KYPHOPLASTY N/A 03/19/2014   Procedure: Thoracic twelve KYPHOPLASTY;  Surgeon: Maeola Harman, MD;  Location: [facility];  Service: Neurosurgery;  Laterality: N/A;  . TOTAL KNEE ARTHROPLASTY     bil     Social History:  reports that she has never smoked. She has never used smokeless tobacco. She reports that she does not drink alcohol or use drugs.  Allergies  Allergen Reactions  . Other Anaphylaxis    Uncoded Allergy. Allergen: THALLIUM   Family History  Problem Relation Age of Onset  . Alzheimer's disease Mother   . Alzheimer's disease Sister     Prior to Admission medications   Medication Sig Start Date End Date Taking? Authorizing Provider  acetaminophen (TYLENOL) 650 MG CR tablet Take 650 mg by mouth 3 (three) times daily.   Yes Historical Provider, MD  alendronate (FOSAMAX) 70 MG tablet Take 70 mg by mouth once a week. Take with a full glass of water on an empty stomach.   Yes Historical Provider, MD  alum & mag hydroxide-simeth (MAALOX/MYLANTA) 200-200-20 MG/5ML suspension Take 30 mLs by mouth every 4 (four) hours as needed for indigestion or heartburn.   Yes Historical Provider, MD  amLODipine (NORVASC) 5 MG tablet Take 5 mg by mouth daily.   Yes Historical Provider, MD  aspirin EC 81 MG EC tablet Take 1 tablet (  81 mg total) by mouth daily. 05/01/16  Yes Delfino Lovett, MD  Calcium Carb-Cholecalciferol (CALCIUM 600 + D) 600-200 MG-UNIT TABS Take 1 tablet by mouth every morning.    Yes Historical Provider, MD  Cholecalciferol (VITAMIN D3) 50000 units CAPS Take 1 capsule by mouth every 30 (thirty) days.   Yes Historical Provider, MD  docusate sodium (COLACE) 100 MG capsule Take 100 mg by mouth 2 (two) times daily.    Yes  Historical Provider, MD  donepezil (ARICEPT) 10 MG tablet Take 10 mg by mouth at bedtime.   Yes Historical Provider, MD  losartan-hydrochlorothiazide (HYZAAR) 100-25 MG tablet Take 1 tablet by mouth daily.   Yes Historical Provider, MD  mirabegron ER (MYRBETRIQ) 50 MG TB24 tablet Take 50 mg by mouth daily.   Yes Historical Provider, MD  Multiple Vitamin (MULTIVITAMIN WITH MINERALS) TABS tablet Take 1 tablet by mouth daily.   Yes Historical Provider, MD  potassium chloride (K-DUR,KLOR-CON) 10 MEQ tablet Take 10 mEq by mouth 2 (two) times daily.   Yes Historical Provider, MD  memantine (NAMENDA) 5 MG tablet Take 1 tablet (5 mg total) by mouth daily. Patient not taking: Reported on 09/12/2016 05/09/16   Lorre Munroe, NP  metoprolol tartrate (LOPRESSOR) 25 MG tablet Take 0.5 tablets (12.5 mg total) by mouth 2 (two) times daily. Hold if HR < 60 or SBP < 100 Patient not taking: Reported on 09/12/2016 04/30/16   Delfino Lovett, MD    Physical Exam: Blood pressure (!) 110/57, pulse 60, temperature 97.8 F (36.6 C), temperature source Oral, resp. rate 14, height  (1.575 m), weight 74.8 kg (165 lb), SpO2 91 %. @ [employer]   09/12/16 0951  Weight: 74.8 kg (165 lb)   No intake or output data in the 24 hours ending 09/12/16 1216   Constitutional: Appears well-developed and well-nourished. No distress. HENT: Normocephalic. Marland Kitchen Oropharynx is clear and moist.  Eyes: Conjunctivae and EOM are normal. PERRLA, no scleral icterus.  Neck: Normal ROM. Neck supple. No JVD. No tracheal deviation. CVS: tachycardia no murmurs, no gallops, no carotid bruit.  Pulmonary: Effort and breath sounds normal, no stridor, rhonchi, wheezes, rales.  Abdominal: Soft. BS +,  no distension, tenderness, rebound or guarding.  Musculoskeletal: Normal range of motion. 1+ b/l LEE and no tenderness.  Neuro: Alert. CN 2-12 grossly intact. No focal deficits. Skin: Skin is warm and dry. No rash noted. Psychiatric: Normal  mood   Labs  Basic Metabolic Panel:  Recent Labs Lab 09/12/16 0951  NA 142  K 3.6  CL 105  CO2 28  GLUCOSE 98  BUN 44*  CREATININE 1.41*  CALCIUM 9.8   Liver Function Tests:  Recent Labs Lab 09/12/16 0951  AST 29  ALT 24  ALKPHOS 46  BILITOT 0.9  PROT 7.3  ALBUMIN 4.4   No results for input(s): LIPASE, AMYLASE in the last 168 hours.  CBC:  Recent Labs Lab 09/12/16 0951  WBC 6.1  HGB 13.7  HCT 40.8  MCV 85.0  PLT 187   Cardiac Enzymes:  Recent Labs Lab 09/12/16 0951  TROPONINI 0.06*   BNP: Invalid input(s): POCBNP CBG: No results for input(s): GLUCAP in the last 168 hours.  Radiological Exams: Dg Chest Port 1 View  Result Date: 09/12/2016 CLINICAL DATA:  Tachycardia. EXAM: PORTABLE CHEST 1 VIEW COMPARISON:  Radiographs of April 28, 2016. FINDINGS: Stable cardiomediastinal silhouette. Stable mild central pulmonary vascular congestion is noted. No pneumothorax or pleural effusion is noted. No acute pulmonary  disease is noted. Deformity of proximal right humerus is noted consistent with old fracture. Atherosclerosis of thoracic aorta is noted. IMPRESSION: Stable mild central pulmonary vascular congestion. Aortic atherosclerosis. No significant change compared to prior exam. Electronically Signed   By: Lupita Raider, M.D.   On: 09/12/2016 10:07    EKG: wide complex tachycardia       Note: This dictation was prepared with Dragon dictation along with smaller phrase technology. Any transcriptional errors that result from this process are unintentional.  Time spent: 45 minutes  Nerissa Constantin, MD

## 2016-09-12 NOTE — Telephone Encounter (Signed)
Letter done $20 charge  Call daughter to find out if she wants it mailed or for pick up

## 2016-09-12 NOTE — ED Triage Notes (Signed)
Pt arrived via EMS from [facility] for reports of elevated heart rate. EMS reports NSR, HR 153, BP 130/90. Pt denies chest pain.

## 2016-09-13 DIAGNOSIS — I471 Supraventricular tachycardia: Secondary | ICD-10-CM | POA: Diagnosis not present

## 2016-09-21 DIAGNOSIS — G301 Alzheimer's disease with late onset: Secondary | ICD-10-CM

## 2016-09-21 DIAGNOSIS — R3981 Functional urinary incontinence: Secondary | ICD-10-CM

## 2016-09-21 DIAGNOSIS — I471 Supraventricular tachycardia: Secondary | ICD-10-CM | POA: Diagnosis not present

## 2016-09-21 DIAGNOSIS — I10 Essential (primary) hypertension: Secondary | ICD-10-CM

## 2016-10-05 ENCOUNTER — Telehealth: Payer: Self-pay

## 2016-10-05 NOTE — Telephone Encounter (Signed)
Jonna CoupSherry Vassello request another letter for another financial transaction like the letter written by Dr Alphonsus SiasLetvak  09/12/16. Cordelia PenSherry still has the 09/12/16 letter and I advised with the wording in the letter that this determination would be permanent that letter should work at a financial institution. Cordelia PenSherry voiced understanding and will cb if needed.

## 2016-12-07 DIAGNOSIS — I10 Essential (primary) hypertension: Secondary | ICD-10-CM | POA: Diagnosis not present

## 2016-12-07 DIAGNOSIS — G309 Alzheimer's disease, unspecified: Secondary | ICD-10-CM | POA: Diagnosis not present

## 2016-12-07 DIAGNOSIS — M810 Age-related osteoporosis without current pathological fracture: Secondary | ICD-10-CM

## 2016-12-07 DIAGNOSIS — E785 Hyperlipidemia, unspecified: Secondary | ICD-10-CM

## 2016-12-07 DIAGNOSIS — N3281 Overactive bladder: Secondary | ICD-10-CM | POA: Diagnosis not present

## 2017-01-25 DIAGNOSIS — I10 Essential (primary) hypertension: Secondary | ICD-10-CM | POA: Diagnosis not present

## 2017-01-25 DIAGNOSIS — F339 Major depressive disorder, recurrent, unspecified: Secondary | ICD-10-CM | POA: Diagnosis not present

## 2017-01-25 DIAGNOSIS — M545 Low back pain: Secondary | ICD-10-CM | POA: Diagnosis not present

## 2017-01-25 DIAGNOSIS — G309 Alzheimer's disease, unspecified: Secondary | ICD-10-CM | POA: Diagnosis not present

## 2017-01-25 DIAGNOSIS — M810 Age-related osteoporosis without current pathological fracture: Secondary | ICD-10-CM | POA: Diagnosis not present

## 2017-01-25 DIAGNOSIS — R3981 Functional urinary incontinence: Secondary | ICD-10-CM

## 2017-01-25 DIAGNOSIS — I471 Supraventricular tachycardia: Secondary | ICD-10-CM | POA: Diagnosis not present

## 2017-03-27 DIAGNOSIS — K0889 Other specified disorders of teeth and supporting structures: Secondary | ICD-10-CM | POA: Diagnosis not present

## 2017-04-08 DIAGNOSIS — I10 Essential (primary) hypertension: Secondary | ICD-10-CM | POA: Diagnosis not present

## 2017-04-08 DIAGNOSIS — M810 Age-related osteoporosis without current pathological fracture: Secondary | ICD-10-CM

## 2017-04-08 DIAGNOSIS — G301 Alzheimer's disease with late onset: Secondary | ICD-10-CM | POA: Diagnosis not present

## 2017-04-08 DIAGNOSIS — I471 Supraventricular tachycardia: Secondary | ICD-10-CM | POA: Diagnosis not present

## 2017-04-08 DIAGNOSIS — R3981 Functional urinary incontinence: Secondary | ICD-10-CM

## 2017-06-05 DIAGNOSIS — I471 Supraventricular tachycardia: Secondary | ICD-10-CM | POA: Diagnosis not present

## 2017-06-05 DIAGNOSIS — N3941 Urge incontinence: Secondary | ICD-10-CM | POA: Diagnosis not present

## 2017-06-05 DIAGNOSIS — M1000 Idiopathic gout, unspecified site: Secondary | ICD-10-CM | POA: Diagnosis not present

## 2017-06-05 DIAGNOSIS — G309 Alzheimer's disease, unspecified: Secondary | ICD-10-CM

## 2017-06-05 DIAGNOSIS — I10 Essential (primary) hypertension: Secondary | ICD-10-CM | POA: Diagnosis not present

## 2017-06-19 DIAGNOSIS — R05 Cough: Secondary | ICD-10-CM | POA: Diagnosis not present

## 2017-06-19 DIAGNOSIS — R509 Fever, unspecified: Secondary | ICD-10-CM

## 2017-07-03 DIAGNOSIS — L723 Sebaceous cyst: Secondary | ICD-10-CM | POA: Diagnosis not present

## 2017-07-31 DIAGNOSIS — M810 Age-related osteoporosis without current pathological fracture: Secondary | ICD-10-CM

## 2017-07-31 DIAGNOSIS — R339 Retention of urine, unspecified: Secondary | ICD-10-CM | POA: Diagnosis not present

## 2017-07-31 DIAGNOSIS — G301 Alzheimer's disease with late onset: Secondary | ICD-10-CM

## 2017-07-31 DIAGNOSIS — I10 Essential (primary) hypertension: Secondary | ICD-10-CM

## 2017-07-31 DIAGNOSIS — I471 Supraventricular tachycardia: Secondary | ICD-10-CM | POA: Diagnosis not present

## 2017-09-27 DIAGNOSIS — I471 Supraventricular tachycardia: Secondary | ICD-10-CM | POA: Diagnosis not present

## 2017-09-27 DIAGNOSIS — M545 Low back pain: Secondary | ICD-10-CM

## 2017-09-27 DIAGNOSIS — G309 Alzheimer's disease, unspecified: Secondary | ICD-10-CM

## 2017-09-27 DIAGNOSIS — M1000 Idiopathic gout, unspecified site: Secondary | ICD-10-CM

## 2017-09-27 DIAGNOSIS — I10 Essential (primary) hypertension: Secondary | ICD-10-CM | POA: Diagnosis not present

## 2017-09-27 DIAGNOSIS — N3281 Overactive bladder: Secondary | ICD-10-CM | POA: Diagnosis not present

## 2017-12-05 DIAGNOSIS — I10 Essential (primary) hypertension: Secondary | ICD-10-CM | POA: Diagnosis not present

## 2017-12-05 DIAGNOSIS — G301 Alzheimer's disease with late onset: Secondary | ICD-10-CM | POA: Diagnosis not present

## 2017-12-05 DIAGNOSIS — I471 Supraventricular tachycardia: Secondary | ICD-10-CM

## 2017-12-05 DIAGNOSIS — M549 Dorsalgia, unspecified: Secondary | ICD-10-CM | POA: Diagnosis not present

## 2018-01-22 DIAGNOSIS — G309 Alzheimer's disease, unspecified: Secondary | ICD-10-CM | POA: Diagnosis not present

## 2018-01-22 DIAGNOSIS — I471 Supraventricular tachycardia: Secondary | ICD-10-CM

## 2018-01-22 DIAGNOSIS — R3911 Hesitancy of micturition: Secondary | ICD-10-CM

## 2018-01-22 DIAGNOSIS — I10 Essential (primary) hypertension: Secondary | ICD-10-CM | POA: Diagnosis not present

## 2018-01-22 DIAGNOSIS — M810 Age-related osteoporosis without current pathological fracture: Secondary | ICD-10-CM

## 2018-01-22 DIAGNOSIS — N183 Chronic kidney disease, stage 3 (moderate): Secondary | ICD-10-CM

## 2018-01-22 DIAGNOSIS — M545 Low back pain: Secondary | ICD-10-CM

## 2018-02-12 DIAGNOSIS — R197 Diarrhea, unspecified: Secondary | ICD-10-CM | POA: Diagnosis not present

## 2018-02-13 DIAGNOSIS — L570 Actinic keratosis: Secondary | ICD-10-CM

## 2018-03-26 ENCOUNTER — Other Ambulatory Visit
Admission: RE | Admit: 2018-03-26 | Discharge: 2018-03-26 | Disposition: A | Discharge status: Home / Self Care | Payer: Medicare Other | Source: Ambulatory Visit | Attending: Gastroenterology | Admitting: Gastroenterology

## 2018-03-26 DIAGNOSIS — R10823 Right lower quadrant rebound abdominal tenderness: Secondary | ICD-10-CM | POA: Diagnosis not present

## 2018-03-26 DIAGNOSIS — R630 Anorexia: Secondary | ICD-10-CM | POA: Diagnosis not present

## 2018-03-26 DIAGNOSIS — G309 Alzheimer's disease, unspecified: Secondary | ICD-10-CM

## 2018-03-26 DIAGNOSIS — K591 Functional diarrhea: Secondary | ICD-10-CM | POA: Diagnosis present

## 2018-03-26 DIAGNOSIS — F22 Delusional disorders: Secondary | ICD-10-CM | POA: Diagnosis not present

## 2018-03-26 LAB — GASTROINTESTINAL PANEL BY PCR, STOOL (REPLACES STOOL CULTURE)
Adenovirus F40/41: NOT DETECTED
Astrovirus: NOT DETECTED
CYCLOSPORA CAYETANENSIS: NOT DETECTED
Campylobacter species: NOT DETECTED
Cryptosporidium: NOT DETECTED
ENTAMOEBA HISTOLYTICA: NOT DETECTED
ENTEROAGGREGATIVE E COLI (EAEC): NOT DETECTED
Enteropathogenic E coli (EPEC): NOT DETECTED
Enterotoxigenic E coli (ETEC): NOT DETECTED
GIARDIA LAMBLIA: NOT DETECTED
NOROVIRUS GI/GII: NOT DETECTED
Plesimonas shigelloides: NOT DETECTED
Rotavirus A: NOT DETECTED
SALMONELLA SPECIES: NOT DETECTED
SHIGELLA/ENTEROINVASIVE E COLI (EIEC): NOT DETECTED
Sapovirus (I, II, IV, and V): NOT DETECTED
Shiga like toxin producing E coli (STEC): NOT DETECTED
VIBRIO CHOLERAE: NOT DETECTED
VIBRIO SPECIES: NOT DETECTED
YERSINIA ENTEROCOLITICA: NOT DETECTED

## 2018-03-26 LAB — C DIFFICILE QUICK SCREEN W PCR REFLEX
C DIFFICLE (CDIFF) ANTIGEN: NEGATIVE
C Diff interpretation: NOT DETECTED
C Diff toxin: NEGATIVE

## 2018-03-28 LAB — CALPROTECTIN, FECAL: CALPROTECTIN, FECAL: 130 ug/g — AB (ref 0–120)

## 2018-04-01 LAB — O&P RESULT

## 2018-04-01 LAB — GIARDIA, EIA; OVA/PARASITE: Giardia Ag, Stl: NEGATIVE

## 2018-04-11 DIAGNOSIS — R4181 Age-related cognitive decline: Secondary | ICD-10-CM

## 2018-05-21 DEATH — deceased
